# Patient Record
Sex: Male | Born: 1940 | Race: White | Hispanic: No | Marital: Married | Smoking: Former smoker
Health system: Southern US, Community
[De-identification: ages and names within clinical notes are randomized; demographics above are authoritative.]

## PROBLEM LIST (undated history)

## (undated) DIAGNOSIS — Z8701 Personal history of pneumonia (recurrent): Secondary | ICD-10-CM

## (undated) DIAGNOSIS — K08109 Complete loss of teeth, unspecified cause, unspecified class: Secondary | ICD-10-CM

## (undated) DIAGNOSIS — Z972 Presence of dental prosthetic device (complete) (partial): Secondary | ICD-10-CM

## (undated) DIAGNOSIS — Z87442 Personal history of urinary calculi: Secondary | ICD-10-CM

## (undated) DIAGNOSIS — R112 Nausea with vomiting, unspecified: Secondary | ICD-10-CM

## (undated) DIAGNOSIS — I6529 Occlusion and stenosis of unspecified carotid artery: Secondary | ICD-10-CM

## (undated) DIAGNOSIS — M1990 Unspecified osteoarthritis, unspecified site: Secondary | ICD-10-CM

## (undated) DIAGNOSIS — E785 Hyperlipidemia, unspecified: Secondary | ICD-10-CM

## (undated) DIAGNOSIS — Z9889 Other specified postprocedural states: Secondary | ICD-10-CM

## (undated) DIAGNOSIS — Z8709 Personal history of other diseases of the respiratory system: Secondary | ICD-10-CM

## (undated) DIAGNOSIS — E669 Obesity, unspecified: Secondary | ICD-10-CM

## (undated) DIAGNOSIS — H9190 Unspecified hearing loss, unspecified ear: Secondary | ICD-10-CM

## (undated) DIAGNOSIS — I10 Essential (primary) hypertension: Secondary | ICD-10-CM

## (undated) HISTORY — PX: NASAL SINUS SURGERY: SHX719

## (undated) HISTORY — PX: SPINE SURGERY: SHX786

## (undated) HISTORY — DX: Obesity, unspecified: E66.9

## (undated) HISTORY — DX: Unspecified osteoarthritis, unspecified site: M19.90

## (undated) HISTORY — DX: Essential (primary) hypertension: I10

## (undated) HISTORY — DX: Occlusion and stenosis of unspecified carotid artery: I65.29

## (undated) HISTORY — DX: Hyperlipidemia, unspecified: E78.5

---

## 2007-12-01 ENCOUNTER — Encounter: Admission: RE | Admit: 2007-12-01 | Discharge: 2007-12-01 | Payer: Self-pay | Admitting: Orthopaedic Surgery

## 2007-12-05 ENCOUNTER — Encounter: Admission: RE | Admit: 2007-12-05 | Discharge: 2007-12-05 | Payer: Self-pay | Admitting: Orthopaedic Surgery

## 2007-12-20 ENCOUNTER — Encounter: Admission: RE | Admit: 2007-12-20 | Discharge: 2007-12-20 | Payer: Self-pay | Admitting: Orthopaedic Surgery

## 2008-01-13 ENCOUNTER — Encounter: Admission: RE | Admit: 2008-01-13 | Discharge: 2008-01-13 | Payer: Self-pay | Admitting: Orthopaedic Surgery

## 2009-12-10 ENCOUNTER — Encounter: Admission: RE | Admit: 2009-12-10 | Discharge: 2009-12-10 | Payer: Self-pay | Admitting: Orthopaedic Surgery

## 2010-05-24 ENCOUNTER — Encounter: Payer: Self-pay | Admitting: Cardiovascular Disease

## 2010-06-03 ENCOUNTER — Encounter: Payer: Self-pay | Admitting: *Deleted

## 2010-06-06 ENCOUNTER — Ambulatory Visit (INDEPENDENT_AMBULATORY_CARE_PROVIDER_SITE_OTHER): Payer: Medicare Other | Admitting: Cardiovascular Disease

## 2010-06-06 ENCOUNTER — Encounter: Payer: Self-pay | Admitting: Cardiovascular Disease

## 2010-06-06 VITALS — BP 158/100 | HR 57 | Ht 72.0 in | Wt 215.2 lb

## 2010-06-06 DIAGNOSIS — I10 Essential (primary) hypertension: Secondary | ICD-10-CM | POA: Insufficient documentation

## 2010-06-06 DIAGNOSIS — R9431 Abnormal electrocardiogram [ECG] [EKG]: Secondary | ICD-10-CM | POA: Insufficient documentation

## 2010-06-06 MED ORDER — SIMVASTATIN 80 MG PO TABS: 40.0000 mg | ORAL_TABLET | Freq: Every day | ORAL | Status: DC

## 2010-06-06 NOTE — Progress Notes (Signed)
Eric Hahn Date of Birth  09/15/1940 [facility] / [facility] [address]     [address] [place], [place]  [address] [phone]  Fax  [phone]  History of Present Illness:  [facility].  No CP, dyspnea.  Walks 2-3 miles QD at the [facility].  Worked at [employer] for years. 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  No hx of syncope, no nausea or vomiting.  Eric Hahn is a middle-age gentleman who recently presented to the urgent medical care for a routine physical exam. He was found have an abnormal EKG. He denies any syncope or presyncope. He exercises at the [facility] and walks 2-3 miles every day.   Current Outpatient Prescriptions on File Prior to Visit  Medication Sig Dispense Refill  . aspirin 81 MG tablet Take 81 mg by mouth daily.        . fosinopril (MONOPRIL) 40 MG tablet Take 40 mg by mouth daily.        . simvastatin (ZOCOR) 80 MG tablet Take 80 mg by mouth at bedtime.          Allergies  Allergen Reactions  . Amoxicillin     Past Medical History  Diagnosis Date  . Hyperlipidemia   . Obesity   . Hypertension     No past surgical history on file.  History  Smoking status  . Former Smoker  . Types: Cigarettes  . Quit date: 06/06/1975  Smokeless tobacco  . Not on file    History  Alcohol Use No    No family history on file.  Reviw of Systems:  Reviewed in the HPI.  All other systems are negative.  Physical Exam: BP 158/100  Pulse 57  Ht 6' (1.829 m)  Wt 215 lb 3.2 oz (97.614 kg)  BMI 29.19 kg/m2 The patient is alert and oriented x 3.  The mood and affect are normal.  The skin is warm and dry.  Color is normal.  The HEENT exam reveals that the sclera are nonicteric.  The mucous membranes are moist.  The carotids are 2+ without bruits.  There is no thyromegaly.  There is no JVD.  The lungs are clear.  The chest wall is non tender.  The heart exam reveals a regular rate  with a normal S1 and S2.  There are no murmurs, gallops, or rubs.  The PMI is not displaced.   Abdominal exam reveals good bowel sounds.  There is no guarding or rebound.  There is no hepatosplenomegaly or tenderness.  There are no masses.  Exam of the legs reveal no clubbing, cyanosis, or edema.  The legs are without rashes.  The distal pulses are intact.  Cranial nerves II - XII are intact.  Motor and sensory functions are intact.  The gait is normal.  ECG: Sinus bradycardia. He has small inferior Q waves. Assessment / Plan:

## 2010-06-06 NOTE — Assessment & Plan Note (Signed)
Eric Hahn presents with an abnormal EKG. He's not had any chest pain, dyspnea, nausea or vomiting, or syncope. It is quite possible that EKG is normal for him. Another possibility is that he has had a silent myocardial infarction in the past. We will schedule him for a stress Myoview study for further evaluation. If this test is normal then I do not think that I need to see him on a regular basis. We'll have him return to see the doctors at urgent medical care

## 2010-06-06 NOTE — Assessment & Plan Note (Signed)
His blood pressures typically normal. He sometimes has a elevated blood pressure he does have a doctor. We'll have him followup with the doctors at [facility].

## 2010-06-06 NOTE — Patient Instructions (Addendum)
Decrease your simvastatin to 40 mg a day.  Change to Pravastatin as directed by Dr. Cleta Alberts when you run out of the Simvastatin.

## 2010-06-13 ENCOUNTER — Ambulatory Visit (HOSPITAL_COMMUNITY): Payer: Medicare Other | Attending: Cardiovascular Disease | Admitting: Radiology

## 2010-06-13 ENCOUNTER — Telehealth: Payer: Self-pay | Admitting: *Deleted

## 2010-06-13 ENCOUNTER — Encounter: Payer: Self-pay | Admitting: Cardiovascular Disease

## 2010-06-13 VITALS — Ht 72.0 in | Wt 210.0 lb

## 2010-06-13 DIAGNOSIS — R9431 Abnormal electrocardiogram [ECG] [EKG]: Secondary | ICD-10-CM | POA: Insufficient documentation

## 2010-06-13 DIAGNOSIS — I4949 Other premature depolarization: Secondary | ICD-10-CM

## 2010-06-13 MED ORDER — TECHNETIUM TC 99M TETROFOSMIN IV KIT
33.0000 | PACK | Freq: Once | INTRAVENOUS | Status: AC | PRN
  Administered 2010-06-13: 33 via INTRAVENOUS

## 2010-06-13 MED ORDER — TECHNETIUM TC 99M TETROFOSMIN IV KIT
10.3000 | PACK | Freq: Once | INTRAVENOUS | Status: AC | PRN
  Administered 2010-06-13: 10 via INTRAVENOUS

## 2010-06-13 NOTE — Telephone Encounter (Signed)
Patient called with echo results. Pt verbalized understanding. Jodette Theon Sobotka RN  

## 2010-06-13 NOTE — Telephone Encounter (Signed)
Message copied by Mahalia Longest on Mon Jun 13, 2010  5:11 PM ------      Message from: [place], [place]      Created: Mon Jun 13, 2010  4:39 PM       Please report to patient - normal study.

## 2010-06-13 NOTE — Progress Notes (Signed)
[facility] SITE 3 NUCLEAR MED [address] [place] [address] [phone]  Cardiology Nuclear Med Study  Eric Hahn is a 70 y.o. male [id number] 1940/12/21   Nuclear Med Background Indication for Stress Test:  Evaluation for Ischemia and Abnormal EKG History:  No previous documented CAD Cardiac Risk Factors: History of Smoking, Hypertension, Lipids and Obesity  Symptoms:  Palpitations   Nuclear Pre-Procedure Caffeine/Decaff Intake:  None NPO After: 5:30pm   Lungs:  clear IV 0.9% NS with Angio Cath:  20g  IV Site: R Hand  IV Started by:  Irean Hong, RN  Chest Size (in):  42 Cup Size: n/a  Height: 6' (1.829 m)  Weight:  210 lb (95.255 kg)  BMI:  Body mass index is 28.48 kg/(m^2). Tech Comments:  Took monopril this am per patient    Nuclear Med Study 1 or 2 day study: 1 day  Stress Test Type:  Stress  Reading MD: Kristeen Miss, MD  Order Authorizing Provider: P.Nahser  Resting Radionuclide: Technetium 65m Tetrofosmin  Resting Radionuclide Dose: 10.3 mCi   Stress Radionuclide:  Technetium 66m Tetrofosmin  Stress Radionuclide Dose: 33.0 mCi           Stress Protocol Rest HR: 58 Stress HR: 136  Rest BP: 124/84 Stress BP: 181/82  Exercise Time (min): 7:35 METS: 9.50   Predicted Max HR: 152 bpm % Max HR: 89.47 bpm Rate Pressure Product: 57846   Dose of Adenosine (mg):  n/a Dose of Lexiscan: n/a mg  Dose of Atropine (mg): n/a Dose of Dobutamine: n/a mcg/kg/min (at max HR)  Stress Test Technologist: Milana Na, EMT-P  Nuclear Technologist:  Domenic Polite, CNMT     Rest Procedure:  Myocardial perfusion imaging was performed at rest 45 minutes following the intravenous administration of Technetium 69m Tetrofosmin. Rest ECG: Sinus Bradycardia  Stress Procedure:  The patient exercised for 7:35.  The patient stopped due to sob, fatigue, and denied any chest pain.  There were no significant ST-T wave changes and occ pvcs.  Technetium 78m  Tetrofosmin was injected at peak exercise and myocardial perfusion imaging was performed after a brief delay. Stress ECG: No significant change from baseline ECG  QPS Raw Data Images:  Normal; no motion artifact; normal heart/lung ratio. Stress Images:  There is mild apical thinning with normal uptake in the remaining areas. Rest Images:  There is mild apical thinning with normal uptake in the remaining areas. Subtraction (SDS):  No evidence of ischemia. Transient Ischemic Dilatation (Normal <1.22):  0.95 Lung/Heart Ratio (Normal <0.45):  0.35  Quantitative Gated Spect Images QGS EDV:  117 ml QGS ESV:  44 ml QGS cine images:  NL LV Function; NL Wall Motion.  The apex contracts very well. QGS EF: 62%  Impression Exercise Capacity:  Fair exercise capacity. BP Response:  Normal blood pressure response. Clinical Symptoms:  No chest pain. ECG Impression:  No significant ST segment change suggestive of ischemia. Comparison with Prior Nuclear Study: No images to compare  Overall Impression:  Normal stress nuclear study.  There is mild apical thinning but no evidence of ischemia.   The LV function is normal.  The apex contracts especially well.   [name].

## 2010-06-14 NOTE — Progress Notes (Signed)
ROUTED TO DR. NAHSER.Eric Hahn ° ° °

## 2010-06-15 ENCOUNTER — Telehealth: Payer: Self-pay | Admitting: *Deleted

## 2010-06-15 NOTE — Telephone Encounter (Signed)
Patient called with normal nuc results. Eric Huberta Tompkins rn

## 2011-07-05 ENCOUNTER — Other Ambulatory Visit: Payer: Self-pay | Admitting: Emergency Medicine

## 2011-07-10 ENCOUNTER — Ambulatory Visit (INDEPENDENT_AMBULATORY_CARE_PROVIDER_SITE_OTHER): Payer: Medicare Other | Admitting: Emergency Medicine

## 2011-07-10 VITALS — BP 138/78 | HR 53 | Temp 97.4°F | Resp 16 | Ht 71.5 in | Wt 212.8 lb

## 2011-07-10 DIAGNOSIS — E785 Hyperlipidemia, unspecified: Secondary | ICD-10-CM

## 2011-07-10 DIAGNOSIS — I10 Essential (primary) hypertension: Secondary | ICD-10-CM

## 2011-07-10 LAB — COMPREHENSIVE METABOLIC PANEL
Albumin: 4.8 g/dL (ref 3.5–5.2)
Alkaline Phosphatase: 51 U/L (ref 39–117)
BUN: 14 mg/dL (ref 6–23)
CO2: 25 mEq/L (ref 19–32)
Glucose, Bld: 99 mg/dL (ref 70–99)
Potassium: 4.1 mEq/L (ref 3.5–5.3)

## 2011-07-10 LAB — POCT CBC
HCT, POC: 41.9 % — AB (ref 43.5–53.7)
Hemoglobin: 13.9 g/dL — AB (ref 14.1–18.1)
MCH, POC: 30.4 pg (ref 27–31.2)
MCV: 91.7 fL (ref 80–97)
MID (cbc): 0.5 (ref 0–0.9)
Platelet Count, POC: 237 10*3/uL (ref 142–424)
RBC: 4.57 M/uL — AB (ref 4.69–6.13)
WBC: 5.1 10*3/uL (ref 4.6–10.2)

## 2011-07-10 LAB — LIPID PANEL
Cholesterol: 252 mg/dL — ABNORMAL HIGH (ref 0–200)
HDL: 38 mg/dL — ABNORMAL LOW (ref >39)
Total CHOL/HDL Ratio: 6.6 Ratio

## 2011-07-10 LAB — TSH: TSH: 2.554 u[IU]/mL (ref 0.350–4.500)

## 2011-07-10 MED ORDER — FOSINOPRIL SODIUM 40 MG PO TABS: 40.0000 mg | ORAL_TABLET | Freq: Every day | ORAL | Status: DC

## 2011-07-10 MED ORDER — PRAVASTATIN SODIUM 40 MG PO TABS: 40.0000 mg | ORAL_TABLET | Freq: Every day | ORAL | Status: DC

## 2011-07-10 NOTE — Progress Notes (Signed)
  Subjective:    Patient ID: Eric Hahn, male    DOB: [birth year], 71 y.o.   MRN: [id number]  HPI patient presents today for medication refills and lab work. Hasn't been in office since April 2012. Currently taking medications (pravastatin, fosinopril, and asa) Overall been feeling great. He is fasting today. Up to date on colonoscopy last 2008. No chest pain sob, exercises at [employer] x 5 days a week.     Review of Systems he denies chest pain shortness of breath. He denies any GI symptoms. Last year he went to the cardiologist and had a cardiac workup because of Q waves present on his resting EKG however workup revealed no evidence of cardiac muscle at risk.     Objective:   Physical Exam  Constitutional: He appears well-developed and well-nourished.  HENT:  Right Ear: External ear normal.  Left Ear: External ear normal.  Eyes: Pupils are equal, round, and reactive to light.  Neck: No thyromegaly present.  Cardiovascular: Normal rate and regular rhythm.   Pulmonary/Chest: Effort normal and breath sounds normal. No respiratory distress. He has no wheezes. He has no rales.  Abdominal: He exhibits no distension and no mass. There is no tenderness. There is no rebound and no guarding.    Results for orders placed in visit on 07/10/11  POCT CBC      Component Value Range   WBC 5.1  4.6 - 10.2 (K/uL)   Lymph, poc 2.0  0.6 - 3.4    POC LYMPH PERCENT 39.4  10 - 50 (%L)   MID (cbc) 0.5  0 - 0.9    POC MID % 9.7  0 - 12 (%M)   POC Granulocyte 2.6  2 - 6.9    Granulocyte percent 50.9  37 - 80 (%G)   RBC 4.57 (*) 4.69 - 6.13 (M/uL)   Hemoglobin 13.9 (*) 14.1 - 18.1 (g/dL)   HCT, POC 40.9 (*) 81.1 - 53.7 (%)   MCV 91.7  80 - 97 (fL)   MCH, POC 30.4  27 - 31.2 (pg)   MCHC 33.2  31.8 - 35.4 (g/dL)   RDW, POC 91.4     Platelet Count, POC 237  142 - 424 (K/uL)   MPV 10.0  0 - 99.8 (fL)        Assessment & Plan:  Patient doing well under treatment for hypertension and high cholesterol.  Meds refilled for one year

## 2011-07-11 LAB — PSA, MEDICARE: PSA: 2.3 ng/mL (ref <=4.00)

## 2011-07-13 ENCOUNTER — Other Ambulatory Visit: Payer: Self-pay | Admitting: Emergency Medicine

## 2011-07-13 DIAGNOSIS — E785 Hyperlipidemia, unspecified: Secondary | ICD-10-CM

## 2011-07-13 MED ORDER — ROSUVASTATIN CALCIUM 10 MG PO TABS: 10.0000 mg | ORAL_TABLET | Freq: Every day | ORAL | Status: DC

## 2011-07-13 NOTE — Progress Notes (Signed)
Mailed pt his Rx for Crestor - see notes on lab results

## 2012-05-17 ENCOUNTER — Telehealth: Payer: Self-pay

## 2012-05-17 ENCOUNTER — Ambulatory Visit (INDEPENDENT_AMBULATORY_CARE_PROVIDER_SITE_OTHER): Payer: Medicare Other | Admitting: Emergency Medicine

## 2012-05-17 VITALS — BP 154/92 | HR 55 | Temp 98.6°F | Resp 16 | Ht 72.0 in | Wt 220.0 lb

## 2012-05-17 DIAGNOSIS — I10 Essential (primary) hypertension: Secondary | ICD-10-CM

## 2012-05-17 DIAGNOSIS — E7800 Pure hypercholesterolemia, unspecified: Secondary | ICD-10-CM

## 2012-05-17 DIAGNOSIS — Z139 Encounter for screening, unspecified: Secondary | ICD-10-CM

## 2012-05-17 LAB — POCT CBC
Lymph, poc: 1.8 (ref 0.6–3.4)
MCH, POC: 30.6 pg (ref 27–31.2)
MCHC: 33.1 g/dL (ref 31.8–35.4)
MID (cbc): 0.6 (ref 0–0.9)
MPV: 8.9 fL (ref 0–99.8)
POC Granulocyte: 2.9 (ref 2–6.9)
POC LYMPH PERCENT: 34.3 %L (ref 10–50)
POC MID %: 10.4 %M (ref 0–12)
RDW, POC: 13.4 %
WBC: 5.3 10*3/uL (ref 4.6–10.2)

## 2012-05-17 LAB — COMPREHENSIVE METABOLIC PANEL
AST: 18 U/L (ref 0–37)
Albumin: 4.6 g/dL (ref 3.5–5.2)
Alkaline Phosphatase: 55 U/L (ref 39–117)
BUN: 15 mg/dL (ref 6–23)
Calcium: 9.6 mg/dL (ref 8.4–10.5)
Chloride: 104 mEq/L (ref 96–112)
Glucose, Bld: 109 mg/dL — ABNORMAL HIGH (ref 70–99)
Potassium: 4.2 mEq/L (ref 3.5–5.3)
Sodium: 138 mEq/L (ref 135–145)
Total Protein: 7.4 g/dL (ref 6.0–8.3)

## 2012-05-17 LAB — LIPID PANEL: Cholesterol: 328 mg/dL — ABNORMAL HIGH (ref 0–200)

## 2012-05-17 MED ORDER — FOSINOPRIL SODIUM 40 MG PO TABS: 40.0000 mg | ORAL_TABLET | Freq: Every day | ORAL | Status: DC

## 2012-05-17 NOTE — Progress Notes (Signed)
  Subjective:    Patient ID: Eric Hahn, male    DOB: 12-04-1940, 72 y.o.   MRN: [id number]  HPI for followup of high blood pressure high cholesterol. His problem has been that he has been intolerant to a number of cholesterol drugs. He has tried Zocor Crestor pravastatin and Lipitor. Earlier in the year he had a lot of discomfort in his muscles with severe aching and stopped his Crestor at that time. When he stopped the Crestor he felt better. He denies any episodes of chest pain shortness of breath or GI problems. He is up-to-date on his immunizations for shingles Pneumovax flu. He is up-to-date on his colonoscopy.    Review of Systems     Objective:   Physical Exam patient is alert and cooperative in no distress. His neck is supple. Chest was clear. Cardiac regular no murmurs. Abdomen is soft nontender. Results for orders placed in visit on 05/17/12  POCT CBC      Result Value Range   WBC 5.3  4.6 - 10.2 K/uL   Lymph, poc 1.8  0.6 - 3.4   POC LYMPH PERCENT 34.3  10 - 50 %L   MID (cbc) 0.6  0 - 0.9   POC MID % 10.4  0 - 12 %M   POC Granulocyte 2.9  2 - 6.9   Granulocyte percent 55.3  37 - 80 %G   RBC 4.67 (*) 4.69 - 6.13 M/uL   Hemoglobin 14.3  14.1 - 18.1 g/dL   HCT, POC 40.9 (*) 81.1 - 53.7 %   MCV 92.6  80 - 97 fL   MCH, POC 30.6  27 - 31.2 pg   MCHC 33.1  31.8 - 35.4 g/dL   RDW, POC 91.4     Platelet Count, POC 265  142 - 424 K/uL   MPV 8.9  0 - 99.8 fL  IFOBT (OCCULT BLOOD)      Result Value Range   IFOBT Negative           Assessment & Plan:  Routine labs today. It appears the patient is to statin intolerant and not able to take any of the statins. We can try him on fish oil, red yeast rice ,and flaxseed oil and see if this can control his lipids.

## 2012-05-17 NOTE — Telephone Encounter (Signed)
It has been sent to them already, Monopril, not Lisinopril

## 2012-05-17 NOTE — Telephone Encounter (Signed)
Patient says he lisinopril rx needs to be sent to [facility] pharmacy please call if questions [phone]

## 2012-05-21 ENCOUNTER — Telehealth: Payer: Self-pay | Admitting: Radiology

## 2012-05-21 DIAGNOSIS — E785 Hyperlipidemia, unspecified: Secondary | ICD-10-CM

## 2012-05-21 NOTE — Telephone Encounter (Signed)
Message copied by Caffie Damme on Tue May 21, 2012 10:27 AM ------      Message from: [place], [facility] A      Created: Fri May 17, 2012  7:32 PM                   ----- Message -----         From: [name], MD         Sent: 05/17/2012   5:34 PM           To: [facility] Clinical Message Pool            His cholesterol is 328 which puts him at extreme risk for cardiac disease. I would like to get him an appointment to see one of the cardiac specialists to discuss with him ways we could treat his high cholesterol since he has been intolerant to statins ------

## 2012-05-21 NOTE — Telephone Encounter (Signed)
Patient advised, referral made

## 2012-06-06 ENCOUNTER — Ambulatory Visit: Payer: Medicare Other | Admitting: Physician Assistant

## 2012-06-10 ENCOUNTER — Encounter: Payer: Self-pay | Admitting: Physician Assistant

## 2012-06-10 ENCOUNTER — Ambulatory Visit (INDEPENDENT_AMBULATORY_CARE_PROVIDER_SITE_OTHER): Payer: Medicare Other | Admitting: Physician Assistant

## 2012-06-10 ENCOUNTER — Telehealth: Payer: Self-pay | Admitting: Physician Assistant

## 2012-06-10 VITALS — BP 150/87 | HR 61 | Ht 73.0 in | Wt 219.0 lb

## 2012-06-10 DIAGNOSIS — E785 Hyperlipidemia, unspecified: Secondary | ICD-10-CM | POA: Insufficient documentation

## 2012-06-10 DIAGNOSIS — I10 Essential (primary) hypertension: Secondary | ICD-10-CM

## 2012-06-10 MED ORDER — LOVASTATIN 20 MG PO TABS: ORAL_TABLET | ORAL | Status: DC

## 2012-06-10 NOTE — Progress Notes (Signed)
[address] [place], [place]  [address] Phone: [phone] Fax:  [phone]  Date:  06/10/2012   ID:  Eric Hahn, DOB 06-18-1940, MRN [id number]  PCP:  Lucilla Edin, MD  Primary Cardiologist:  Dr. Delane Ginger     History of Present Illness: Eric Hahn is a 72 y.o. male who returns for evaluation and management of hyperlipidemia.  He also has a history of hypertension. He saw Dr. Elease Hashimoto in 05/2010 for an abnormal ECG. He underwent Myoview 4/12: Apical thinning, no ischemia, EF 62%. Recently seen by his PCP. He has been intolerant to many statins. Simvastatin was discontinued possibly due to palpitations. Lipitor was discontinued due to elevated LFTs. Pravastatin provided no help. Patient developed panic attacks with Crestor. He denies chest pain, syncope, orthopnea, PND, edema or significant dyspnea. He has a family history of CAD. His father had a myocardial infarction in his 2s. He is a prior smoker. He does not have diabetes.  Lab Results  Component Value Date   CHOL 328* 05/17/2012   CHOL 252* 07/10/2011   Lab Results  Component Value Date   HDL 40 05/17/2012   HDL 38* 1/32/4401   Lab Results  Component Value Date   LDLCALC 248* 05/17/2012   LDLCALC 176* 07/10/2011   Lab Results  Component Value Date   TRIG 198* 05/17/2012   TRIG 191* 07/10/2011   Lab Results  Component Value Date   CHOLHDL 8.2 05/17/2012   CHOLHDL 6.6 07/10/2011   No results found for this basename: LDLDIRECT     Labs (5/13):  TSH 2.554 Labs (4/14):  K 4.2, Cr 0.90, ALT 23,Hgb 14.3  Wt Readings from Last 3 Encounters:  06/10/12 219 lb (99.338 kg)  05/17/12 220 lb (99.791 kg)  07/10/11 212 lb 12.8 oz (96.525 kg)     Past Medical History  Diagnosis Date  . Hyperlipidemia   . Obesity   . Hypertension     Current Outpatient Prescriptions  Medication Sig Dispense Refill  . aspirin 81 MG tablet Take 81 mg by mouth daily.        Marland Kitchen FLAX OIL-FISH OIL-BORAGE OIL PO Take by  mouth. 800 mg daily      . Flaxseed, Linseed, (FLAX SEED OIL) 1000 MG CAPS Take by mouth. daily      . fosinopril (MONOPRIL) 40 MG tablet Take 1 tablet (40 mg total) by mouth daily.  90 tablet  3  . Red Yeast Rice 600 MG TABS Take by mouth. Daily      . lovastatin (MEVACOR) 20 MG tablet TAKE ONLY ON MON, WED, AND FRI'S  90 tablet  3   No current facility-administered medications for this visit.    Allergies:    Allergies  Allergen Reactions  . Amoxicillin   . Statins     Pt is intolerant-muscle aches and weakness    Social History:  The patient  reports that he quit smoking about 37 years ago. His smoking use included Cigarettes. He smoked 0.00 packs per day. He does not have any smokeless tobacco history on file. He reports that he does not drink alcohol or use illicit drugs.   ROS:  Please see the history of present illness.   All other systems reviewed and negative.   PHYSICAL EXAM: VS:  BP 150/87  Pulse 61  Ht 6\' 1"  (1.854 m)  Wt 219 lb (99.338 kg)  BMI 28.9 kg/m2 Well nourished, well developed, in no acute distress  HEENT: normal Neck: no JVD Cardiac:  normal S1, S2; RRR; no murmur Lungs:  clear to auscultation bilaterally, no wheezing, rhonchi or rales Abd: soft, nontender, no hepatomegaly Ext: no edema Skin: warm and dry Neuro:  CNs 2-12 intact, no focal abnormalities noted  EKG:  Sinus brady, HR 57, NSSTTW changes     ASSESSMENT AND PLAN:  1. Hyperlipidemia:  He has tried many different statins and has been unable to tolerate all of them.  Current guidelines would not support changing to an alternate non-statin therapy for primary prevention.  His lipid levels are quite elevated.  He does not have proven vascular disease.  We discussed many different options.  I would like for him to see our Lipid Clinic to see if they can help.  I have also suggested trying Livalo or Lovastatin.  We do not have Livalo samples.  Therefore, I will have him take Lovastatin 20 mg QHS on  Mon, Wed, Fri only.  Check Lipids and LFTs in 3 mos.   2. Hypertension:  Usually better controlled.  Continue current Rx. 3. Disposition:  Refer to Lipid clinic.  F/u with Dr. Delane Ginger in 3 mos.   Signed, Tereso Newcomer, PA-C  5:12 PM 06/10/2012

## 2012-06-10 NOTE — Telephone Encounter (Signed)
Called pt to schedule for lipid clinic per request in staff message. Per pt he can not schedule at this time he has things to do and he will call back to schedule

## 2012-06-10 NOTE — Patient Instructions (Addendum)
START LOVASTATIN 20 MG TABLET; TAKE ONLY ON MON, WED, AND FRI'S RX SENT TO [facility] X 3  FASTING LIPID AND LIVER PANEL TO BE DONE IN 3 MONTHS 09/13/12  PLEASE FOLLOW UP WITH DR. Elease Hashimoto IN 3 MONTHS  PLEASE REFER TO LIPID CLINIC DX 272.4

## 2012-06-13 ENCOUNTER — Other Ambulatory Visit: Payer: Self-pay | Admitting: Emergency Medicine

## 2012-06-14 ENCOUNTER — Other Ambulatory Visit: Payer: Self-pay

## 2012-06-14 ENCOUNTER — Other Ambulatory Visit (HOSPITAL_COMMUNITY): Payer: Medicare Other

## 2012-06-14 ENCOUNTER — Other Ambulatory Visit: Payer: Self-pay | Admitting: *Deleted

## 2012-06-14 DIAGNOSIS — I10 Essential (primary) hypertension: Secondary | ICD-10-CM

## 2012-06-14 MED ORDER — FOSINOPRIL SODIUM 40 MG PO TABS: 40.0000 mg | ORAL_TABLET | Freq: Every day | ORAL | Status: DC

## 2012-06-14 NOTE — Telephone Encounter (Signed)
ERROR

## 2012-09-18 ENCOUNTER — Other Ambulatory Visit: Payer: Self-pay

## 2012-09-19 ENCOUNTER — Telehealth: Payer: Self-pay | Admitting: *Deleted

## 2012-09-19 NOTE — Telephone Encounter (Signed)
Called Mr. Schalk to schedule 3 month follow-up with Dr. Elease Hashimoto. Patient does not wish to follow-up with his heart doctor anymore. He will continue to be seen by his primary care physician. I will forward message to Jodette.

## 2012-09-20 NOTE — Telephone Encounter (Signed)
noted 

## 2012-12-19 ENCOUNTER — Other Ambulatory Visit: Payer: Self-pay

## 2013-07-01 ENCOUNTER — Encounter: Payer: Medicare Other | Admitting: Emergency Medicine

## 2013-07-10 ENCOUNTER — Encounter: Payer: Self-pay | Admitting: Emergency Medicine

## 2013-07-10 ENCOUNTER — Ambulatory Visit (INDEPENDENT_AMBULATORY_CARE_PROVIDER_SITE_OTHER): Payer: Medicare Other | Admitting: Emergency Medicine

## 2013-07-10 VITALS — BP 126/90 | HR 60 | Temp 97.7°F | Resp 16 | Ht 71.0 in | Wt 213.0 lb

## 2013-07-10 DIAGNOSIS — I10 Essential (primary) hypertension: Secondary | ICD-10-CM

## 2013-07-10 DIAGNOSIS — E7800 Pure hypercholesterolemia, unspecified: Secondary | ICD-10-CM

## 2013-07-10 DIAGNOSIS — Z125 Encounter for screening for malignant neoplasm of prostate: Secondary | ICD-10-CM

## 2013-07-10 DIAGNOSIS — E785 Hyperlipidemia, unspecified: Secondary | ICD-10-CM

## 2013-07-10 DIAGNOSIS — Z139 Encounter for screening, unspecified: Secondary | ICD-10-CM

## 2013-07-10 DIAGNOSIS — Z0000 Encounter for general adult medical examination without abnormal findings: Secondary | ICD-10-CM

## 2013-07-10 DIAGNOSIS — E7200 Disorders of amino-acid transport, unspecified: Secondary | ICD-10-CM

## 2013-07-10 DIAGNOSIS — Z23 Encounter for immunization: Secondary | ICD-10-CM

## 2013-07-10 LAB — LIPID PANEL
Cholesterol: 297 mg/dL — ABNORMAL HIGH (ref 0–200)
HDL: 43 mg/dL (ref >39)
LDL CALC: 211 mg/dL — AB (ref 0–99)
TRIGLYCERIDES: 216 mg/dL — AB (ref <150)
Total CHOL/HDL Ratio: 6.9 Ratio
VLDL: 43 mg/dL — ABNORMAL HIGH (ref 0–40)

## 2013-07-10 LAB — POCT URINALYSIS DIPSTICK
Bilirubin, UA: NEGATIVE
Blood, UA: NEGATIVE
Glucose, UA: NEGATIVE
KETONES UA: NEGATIVE
Leukocytes, UA: NEGATIVE
Nitrite, UA: NEGATIVE
Protein, UA: NEGATIVE
Spec Grav, UA: 1.015
UROBILINOGEN UA: 0.2
pH, UA: 7

## 2013-07-10 LAB — COMPLETE METABOLIC PANEL WITH GFR
ALT: 22 U/L (ref 0–53)
AST: 19 U/L (ref 0–37)
Albumin: 4.4 g/dL (ref 3.5–5.2)
Alkaline Phosphatase: 54 U/L (ref 39–117)
BUN: 16 mg/dL (ref 6–23)
CHLORIDE: 101 meq/L (ref 96–112)
CO2: 22 meq/L (ref 19–32)
CREATININE: 0.89 mg/dL (ref 0.50–1.35)
Calcium: 9.7 mg/dL (ref 8.4–10.5)
GFR, Est Non African American: 86 mL/min
Glucose, Bld: 101 mg/dL — ABNORMAL HIGH (ref 70–99)
Potassium: 3.9 mEq/L (ref 3.5–5.3)
Sodium: 135 mEq/L (ref 135–145)
Total Bilirubin: 0.7 mg/dL (ref 0.2–1.2)
Total Protein: 7 g/dL (ref 6.0–8.3)

## 2013-07-10 LAB — CBC WITH DIFFERENTIAL/PLATELET
BASOS PCT: 0 % (ref 0–1)
Basophils Absolute: 0 10*3/uL (ref 0.0–0.1)
Eosinophils Absolute: 0.3 10*3/uL (ref 0.0–0.7)
Eosinophils Relative: 5 % (ref 0–5)
HEMATOCRIT: 42.7 % (ref 39.0–52.0)
HEMOGLOBIN: 14.9 g/dL (ref 13.0–17.0)
LYMPHS PCT: 34 % (ref 12–46)
Lymphs Abs: 2 10*3/uL (ref 0.7–4.0)
MCH: 30.3 pg (ref 26.0–34.0)
MCHC: 34.9 g/dL (ref 30.0–36.0)
MCV: 87 fL (ref 78.0–100.0)
MONO ABS: 0.4 10*3/uL (ref 0.1–1.0)
MONOS PCT: 7 % (ref 3–12)
NEUTROS ABS: 3.2 10*3/uL (ref 1.7–7.7)
Neutrophils Relative %: 54 % (ref 43–77)
Platelets: 260 10*3/uL (ref 150–400)
RBC: 4.91 MIL/uL (ref 4.22–5.81)
RDW: 13.6 % (ref 11.5–15.5)
WBC: 5.9 10*3/uL (ref 4.0–10.5)

## 2013-07-10 LAB — IFOBT (OCCULT BLOOD): IFOBT: NEGATIVE

## 2013-07-10 MED ORDER — FOSINOPRIL SODIUM 40 MG PO TABS: 40.0000 mg | ORAL_TABLET | Freq: Every day | ORAL | Status: DC

## 2013-07-10 NOTE — Progress Notes (Signed)
   Subjective:    Patient ID: Eric Hahn, male    DOB: 1940-05-20, 73 y.o.   MRN: [id number] This chart was scribed for Lesle Chris, MD by Marica Otter, ED Scribe. This patient was seen in room 21 and the patient's care was started at 10:21 AM.     HPI  HPI Comments: Eric Hahn is a 73 y.o. male, with  who [facility] exam. Pt reports he has been doing well and has no present complaints.  Cardiac Health: Pt denies annual visits with his cardiologist generally, but, reports that he saw a cardiologist a couple of times in the last 3 years due to an abnormal ECG from 05/2010 and high cholesterol. Pt reports he had a stress test 3 years ago. Pt reports he did not tolerate statin drugs.  Colonoscopy: Pt reports his last colonoscopy was in June 2009 and he was told his next check will be in 10 years.  Prostate: Pt denies any problems with his prostate.  Tetanus Shot: Pt reports he believes he is utd, but not sure.   Pneumonia Vaccine: Pt reports he would like to receive a pneumonia vaccine.   Review of Systems  Constitutional: Negative for fatigue and unexpected weight change.  Eyes: Negative for visual disturbance.  Respiratory: Negative for cough, chest tightness and shortness of breath.   Cardiovascular: Negative for chest pain, palpitations and leg swelling.  Gastrointestinal: Negative for abdominal pain and blood in stool.  Neurological: Negative for dizziness, light-headedness and headaches.   Objective:   Physical Exam CONSTITUTIONAL: Well developed/well nourished HEAD: Normocephalic/atraumatic, full dentures EYES: EOMI/PERRL ENMT: Mucous membranes moist NECK: supple no meningeal signs SPINE:entire spine nontender CV: S1/S2 noted, no murmurs/rubs/gallops noted LUNGS: Lungs are clear to auscultation bilaterally, no apparent distress ABDOMEN: soft, nontender, no rebound or guarding GU:no cva tenderness NEURO: Pt is  awake/alert, moves all extremitiesx4 EXTREMITIES: pulses normal, full ROM SKIN: warm, color normal PSYCH: no abnormalities of mood noted Prostate exam is normal   Ekg nonspecific ST-T changes unchanged from previous  Results for orders placed in visit on 07/10/13  IFOBT (OCCULT BLOOD)      Result Value Ref Range   IFOBT Negative    POCT URINALYSIS DIPSTICK      Result Value Ref Range   Color, UA yellow     Clarity, UA clear     Glucose, UA neg     Bilirubin, UA neg     Ketones, UA neg     Spec Grav, UA 1.015     Blood, UA neg     pH, UA 7.0     Protein, UA neg     Urobilinogen, UA 0.2     Nitrite, UA neg     Leukocytes, UA Negative          Assessment & Plan:   The patient is statin intolerant. He has tried multiple statin drugs and is unable to tolerate any of them. He's not really interested in trying Zetia or WelChol because the data does not demonstrate improvement regarding cardiovascular events. I personally performed the services described in this documentation, which was scribed in my presence. The recorded information has been reviewed and is accurate. Marland Kitchen

## 2013-07-11 LAB — PSA, MEDICARE: PSA: 2.24 ng/mL (ref <=4.00)

## 2013-11-28 ENCOUNTER — Other Ambulatory Visit: Payer: Self-pay

## 2014-04-13 ENCOUNTER — Encounter: Payer: Self-pay | Admitting: *Deleted

## 2014-06-16 ENCOUNTER — Encounter: Payer: Self-pay | Admitting: *Deleted

## 2014-07-22 ENCOUNTER — Ambulatory Visit (INDEPENDENT_AMBULATORY_CARE_PROVIDER_SITE_OTHER): Payer: Medicare Other | Admitting: Emergency Medicine

## 2014-07-22 VITALS — BP 128/82 | HR 75 | Temp 98.5°F | Resp 17 | Ht 71.5 in | Wt 207.0 lb

## 2014-07-22 DIAGNOSIS — E7800 Pure hypercholesterolemia, unspecified: Secondary | ICD-10-CM

## 2014-07-22 DIAGNOSIS — R221 Localized swelling, mass and lump, neck: Secondary | ICD-10-CM | POA: Diagnosis not present

## 2014-07-22 DIAGNOSIS — I10 Essential (primary) hypertension: Secondary | ICD-10-CM

## 2014-07-22 LAB — BASIC METABOLIC PANEL WITH GFR
BUN: 15 mg/dL (ref 6–23)
CHLORIDE: 106 meq/L (ref 96–112)
CO2: 24 mEq/L (ref 19–32)
Calcium: 9.3 mg/dL (ref 8.4–10.5)
Creat: 0.86 mg/dL (ref 0.50–1.35)
GFR, EST NON AFRICAN AMERICAN: 87 mL/min
Glucose, Bld: 105 mg/dL — ABNORMAL HIGH (ref 70–99)
Potassium: 4.4 mEq/L (ref 3.5–5.3)
Sodium: 140 mEq/L (ref 135–145)

## 2014-07-22 LAB — POCT CBC
Granulocyte percent: 55.2 %G (ref 37–80)
HCT, POC: 42.4 % — AB (ref 43.5–53.7)
Hemoglobin: 14.3 g/dL (ref 14.1–18.1)
Lymph, poc: 1.9 (ref 0.6–3.4)
MCH, POC: 30.1 pg (ref 27–31.2)
MCHC: 33.7 g/dL (ref 31.8–35.4)
MCV: 89.3 fL (ref 80–97)
MID (cbc): 0.4 (ref 0–0.9)
MPV: 7.7 fL (ref 0–99.8)
POC Granulocyte: 2.8 (ref 2–6.9)
POC LYMPH %: 37.8 % (ref 10–50)
POC MID %: 7 %M (ref 0–12)
Platelet Count, POC: 242 10*3/uL (ref 142–424)
RBC: 4.75 M/uL (ref 4.69–6.13)
RDW, POC: 13.7 %
WBC: 5.1 10*3/uL (ref 4.6–10.2)

## 2014-07-22 LAB — LIPID PANEL
Cholesterol: 279 mg/dL — ABNORMAL HIGH (ref 0–200)
HDL: 36 mg/dL — ABNORMAL LOW (ref >=40)
LDL CALC: 196 mg/dL — AB (ref 0–99)
TRIGLYCERIDES: 235 mg/dL — AB (ref <150)
Total CHOL/HDL Ratio: 7.8 Ratio
VLDL: 47 mg/dL — ABNORMAL HIGH (ref 0–40)

## 2014-07-22 LAB — TSH: TSH: 2.349 u[IU]/mL (ref 0.350–4.500)

## 2014-07-22 MED ORDER — FOSINOPRIL SODIUM 40 MG PO TABS: 40.0000 mg | ORAL_TABLET | Freq: Every day | ORAL | Status: DC

## 2014-07-22 NOTE — Patient Instructions (Signed)
Please make an appointment to see your gastroenterologist. Please make an appointment for a good eye check. Please schedule a physical sometime in the fall. I have scheduled you for CT of your neck to evaluate the area felt on physical exam.

## 2014-07-22 NOTE — Progress Notes (Signed)
Subjective:  This chart was scribed for Earl Lites, MD by Andrew Au, ED Scribe. This patient was seen in room 9 and the patient's care was started at 8:18 AM.   Patient ID: Eric Hahn, male    DOB: 08/12/1940, 74 y.o.   MRN: [id number]  HPI Chief Complaint  Patient presents with  . Medication Refill    fosinopril    HPI Comments: Eric Hahn is a 74 y.o. male who presents to the [facility] for a medication refill. Pt was last seen by me last year. States he has been doing well. He plans to schedule colonoscopy this year at [facility] and denies needing a referral . States his immunizations are up to date. Pt is seen by optometry every 4 years. Pt is a former smoker who quit over 20 years ago. Pt is fasting. Pt denies back pain and abdominal pain. Pt is planning a vacation to [place] in the July.   Past Medical History  Diagnosis Date  . Hyperlipidemia   . Obesity   . Hypertension    Allergies  Allergen Reactions  . Amoxicillin   . Statins     Pt is intolerant-muscle aches and weakness   Prior to Admission medications   Medication Sig Start Date End Date Taking? Authorizing Provider  aspirin 81 MG tablet Take 81 mg by mouth daily.     Yes Historical Provider, MD  FLAX OIL-FISH OIL-BORAGE OIL PO Take by mouth. 800 mg daily   Yes Historical Provider, MD  Flaxseed, Linseed, (FLAX SEED OIL) 1000 MG CAPS Take by mouth. daily   Yes Historical Provider, MD  fosinopril (MONOPRIL) 40 MG tablet Take 1 tablet (40 mg total) by mouth daily. 07/10/13  Yes [name], MD  Red Yeast Rice 600 MG TABS Take by mouth. Daily   Yes Historical Provider, MD  lovastatin (MEVACOR) 20 MG tablet TAKE ONLY ON MON, WED, AND FRI'S Patient not taking: Reported on 07/22/2014 06/10/12   Beatrice Lecher, PA-C   Review of Systems  Gastrointestinal: Negative for abdominal pain.  Musculoskeletal: Negative for back pain.   Objective:   Physical Exam CONSTITUTIONAL: Well developed/well  nourished HEAD: Normocephalic/atraumatic EYES: EOMI/PERRL ENMT: Mucous membranes moist 1.5 cm nodule adjacent to submaxillary gland. NECK: supple no meningeal signs.  SPINE/BACK:entire spine nontender CV: S1/S2 noted, no murmurs/rubs/gallops noted LUNGS: Lungs are clear to auscultation bilaterally, no apparent distress ABDOMEN: soft, nontender, no rebound or guarding, bowel sounds noted throughout abdomen GU:no cva tenderness NEURO: Pt is awake/alert/appropriate, moves all extremitiesx4.  No facial droop.   EXTREMITIES: pulses normal/equal, full ROM SKIN: warm, color normal PSYCH: no abnormalities of mood noted, alert and oriented to situation Results for orders placed or performed in visit on 07/22/14  POCT CBC  Result Value Ref Range   WBC 5.1 4.6 - 10.2 K/uL   Lymph, poc 1.9 0.6 - 3.4   POC LYMPH PERCENT 37.8 10 - 50 %L   MID (cbc) 0.4 0 - 0.9   POC MID % 7.0 0 - 12 %M   POC Granulocyte 2.8 2 - 6.9   Granulocyte percent 55.2 37 - 80 %G   RBC 4.75 4.69 - 6.13 M/uL   Hemoglobin 14.3 14.1 - 18.1 g/dL   HCT, POC 40.9 (A) 81.1 - 53.7 %   MCV 89.3 80 - 97 fL   MCH, POC 30.1 27 - 31.2 pg   MCHC 33.7 31.8 - 35.4 g/dL   RDW, POC  13.7 %   Platelet Count, POC 242 142 - 424 K/uL   MPV 7.7 0 - 99.8 fL   Filed Vitals:   07/22/14 0816  BP: 128/82  Pulse: 75  Temp: 98.5 F (36.9 C)  TempSrc: Oral  Resp: 17  Height: 5' 11.5" (1.816 m)  Weight: 207 lb (93.895 kg)  SpO2: 98%    Assessment & Plan:   1. Essential hypertension  No change in blood pressure medications   2. Elevated cholesterol  Lipid panel done today. He has been on flaxseed oil but not on statins. He is allergic to statin   3. Neck mass  We'll schedule CT the neck.     I personally performed the services described in this documentation, which was scribed in my presence. The recorded information has been reviewed and is accurate.  Earl LitesSteve Meghen Akopyan, MD

## 2014-07-23 ENCOUNTER — Encounter: Payer: Self-pay | Admitting: Family Medicine

## 2014-07-23 ENCOUNTER — Telehealth: Payer: Self-pay | Admitting: Emergency Medicine

## 2014-07-23 NOTE — Telephone Encounter (Signed)
Attempt to call pt to sched per [facility] referral but per pt's wife pt doesn't want to be seen.   Erin Sons  [facility]

## 2014-07-23 NOTE — Telephone Encounter (Signed)
This patient is estranged from his wife. Referrals needs to talk to the patient himself

## 2014-07-24 NOTE — Telephone Encounter (Signed)
Left message for pt to call back  °

## 2014-07-28 ENCOUNTER — Ambulatory Visit
Admission: RE | Admit: 2014-07-28 | Discharge: 2014-07-28 | Disposition: A | Discharge status: Home / Self Care | Payer: PRIVATE HEALTH INSURANCE | Source: Ambulatory Visit | Attending: Emergency Medicine | Admitting: Emergency Medicine

## 2014-07-28 ENCOUNTER — Other Ambulatory Visit: Payer: Self-pay | Admitting: Emergency Medicine

## 2014-07-28 DIAGNOSIS — R221 Localized swelling, mass and lump, neck: Secondary | ICD-10-CM

## 2014-07-28 DIAGNOSIS — I6523 Occlusion and stenosis of bilateral carotid arteries: Secondary | ICD-10-CM

## 2014-07-28 MED ORDER — IOPAMIDOL (ISOVUE-300) INJECTION 61%
75.0000 mL | Freq: Once | INTRAVENOUS | Status: AC | PRN
  Administered 2014-07-28: 75 mL via INTRAVENOUS

## 2014-08-04 ENCOUNTER — Encounter: Payer: Self-pay | Admitting: Radiology

## 2014-08-07 ENCOUNTER — Telehealth: Payer: Self-pay

## 2014-08-07 NOTE — Telephone Encounter (Signed)
Just an FYI -patients spouse called requesting the status of an appointment for patient to have an ultra sound. I didn't see one scheduled and I had a hard time finding the order but I found it. Anyways just wanted to let Dr Cleta Alberts know I called [facility] today and  scheduled patient for the US Carotid duplex bilateral on Monday 08/10/14 at 1:40 pm.

## 2014-08-10 ENCOUNTER — Ambulatory Visit
Admission: RE | Admit: 2014-08-10 | Discharge: 2014-08-10 | Disposition: A | Discharge status: Home / Self Care | Payer: PRIVATE HEALTH INSURANCE | Source: Ambulatory Visit | Attending: Emergency Medicine | Admitting: Emergency Medicine

## 2014-08-10 DIAGNOSIS — I6523 Occlusion and stenosis of bilateral carotid arteries: Secondary | ICD-10-CM

## 2014-08-11 ENCOUNTER — Other Ambulatory Visit: Payer: Self-pay | Admitting: Emergency Medicine

## 2014-08-11 ENCOUNTER — Ambulatory Visit: Payer: Medicare Other | Admitting: Emergency Medicine

## 2014-08-11 DIAGNOSIS — I6521 Occlusion and stenosis of right carotid artery: Secondary | ICD-10-CM

## 2014-08-12 ENCOUNTER — Encounter: Payer: Self-pay | Admitting: *Deleted

## 2014-08-19 ENCOUNTER — Other Ambulatory Visit: Payer: Self-pay

## 2014-08-19 DIAGNOSIS — I6521 Occlusion and stenosis of right carotid artery: Secondary | ICD-10-CM

## 2014-09-07 ENCOUNTER — Encounter: Payer: Self-pay | Admitting: Vascular Surgery

## 2014-09-10 ENCOUNTER — Encounter: Payer: Self-pay | Admitting: Vascular Surgery

## 2014-09-10 ENCOUNTER — Ambulatory Visit (INDEPENDENT_AMBULATORY_CARE_PROVIDER_SITE_OTHER): Payer: Medicare Other | Admitting: Vascular Surgery

## 2014-09-10 ENCOUNTER — Ambulatory Visit (HOSPITAL_COMMUNITY)
Admission: RE | Admit: 2014-09-10 | Discharge: 2014-09-10 | Disposition: A | Discharge status: Home / Self Care | Payer: Medicare Other | Source: Ambulatory Visit | Attending: Vascular Surgery | Admitting: Vascular Surgery

## 2014-09-10 ENCOUNTER — Other Ambulatory Visit: Payer: Self-pay

## 2014-09-10 VITALS — BP 138/94 | HR 62 | Temp 98.1°F | Resp 14 | Ht 72.0 in | Wt 207.0 lb

## 2014-09-10 DIAGNOSIS — I6521 Occlusion and stenosis of right carotid artery: Secondary | ICD-10-CM

## 2014-09-10 NOTE — Progress Notes (Signed)
[facility] [place] HISTORY AND PHYSICAL   History of Present Illness:  Patient is a 74 y.o. year old male who presents for evaluation of asymptomatic carotid stenosis. Patient had a carotid duplex scan recently which showed greater than 70% right internal carotid artery stenosis. The carotid duplex was performed in follow-up after workup for a nodule in the left neck and calcification of the carotids was seen on CT scan. The patient denies any prior symptoms of TIA amaurosis or stroke. He is currently on aspirin. He is a former smoker but quit 40 years ago. He also has a history of elevated cholesterol but has been intolerant of cholesterol lowering medications in the past. He states that they increased his liver enzymes or gave him anxiety. Other medical problems include hypertension. He did have a cardiac stress test 3-4 years ago which she said was normal. He is able to climb several flights of stairs without becoming short of breath. He denies chest pain. 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  Past Medical History  Diagnosis Date  . Hyperlipidemia   . Obesity   . Hypertension   . Carotid artery occlusion   . Arthritis     Past Surgical History  Procedure Laterality Date  . Spine surgery      C-7 disk surgery  by Dr. Darreld Mclean     Social History History  Substance Use Topics  . Smoking status: Former Smoker    Types: Cigarettes    Quit date: 06/06/1975  . Smokeless tobacco: Not on file  . Alcohol Use: No    Family History Family History  Problem Relation Age of Onset  . Hyperlipidemia Father   . Arthritis Sister     Allergies  Allergies  Allergen Reactions  . Sulfa Antibiotics Other (See Comments)  . Amoxicillin   . Statins     Pt is intolerant-muscle aches and weakness     Current Outpatient Prescriptions  Medication Sig Dispense Refill  . aspirin 81 MG tablet Take 81 mg by mouth daily.      . Flaxseed, Linseed,  (FLAX SEED OIL) 1000 MG CAPS Take by mouth. daily    . fosinopril (MONOPRIL) 40 MG tablet Take 1 tablet (40 mg total) by mouth daily. 90 tablet 3  . KRILL OIL PO Take by mouth.    . Red Yeast Rice 600 MG TABS Take by mouth. Daily    . lovastatin (MEVACOR) 20 MG tablet TAKE ONLY ON MON, WED, AND FRI'S (Patient not taking: Reported on 07/22/2014) 90 tablet 3   No current facility-administered medications for this visit.    ROS:   General:  No weight loss, Fever, chills  HEENT: No recent headaches, no nasal bleeding, no visual changes, no sore throat  Neurologic: No dizziness, blackouts, seizures. No recent symptoms of stroke or mini- stroke. No recent episodes of slurred speech, or temporary blindness.  Cardiac: No recent episodes of chest pain/pressure, no shortness of breath at rest.  No shortness of breath with exertion.  Denies history of atrial fibrillation or irregular heartbeat  Vascular: No history of rest pain in feet.  No history of claudication.  No history of non-healing ulcer, No history of DVT   Pulmonary: No home oxygen, no productive cough, no hemoptysis,  No asthma or wheezing  Musculoskeletal:  [ ]  Arthritis, [ ]  Low back pain,  [ ]  Joint pain  Hematologic:No history of hypercoagulable state.  No history of easy bleeding.  No history of anemia  Gastrointestinal: No hematochezia or melena,  No gastroesophageal reflux, no trouble swallowing  Urinary:  chronic Kidney disease,  on HD -  MWF or  TTHS,  Burning with urination,  Frequent urination,  Difficulty urinating;   Skin: No rashes  Psychological: No history of anxiety,  No history of depression   Physical Examination  Filed Vitals:   09/10/14 1023 09/10/14 1027  BP: 139/90 138/94  Pulse: 60 62  Temp: 98.1 F (36.7 C)   TempSrc: Oral   Resp: 14   Height: 6' (1.829 m)   Weight: 207 lb (93.895 kg)   SpO2: 96%     Body mass index is 28.07 kg/(m^2).  General:  Alert and oriented, no  acute distress HEENT: Normal Neck: No bruit or JVD Pulmonary: Clear to auscultation bilaterally Cardiac: Regular Rate and Rhythm without murmur Abdomen: Soft, non-tender, non-distended, no mass Skin: No rash Extremity Pulses:  2+ radial, brachial, femoral, dorsalis pedis, posterior tibial pulses bilaterally Musculoskeletal: No deformity or edema  Neurologic: Upper and lower extremity motor 5/5 and symmetric  DATA:  Patient repeat carotid duplex exam today which shows a fairly lengthy right internal carotid artery stenosis approaching 80% with normal carotid past the level of stenosis. Carotid bifurcation was mid hyoid. Previous carotid duplex scan showed less than 50% left internal carotid artery stenosis.   ASSESSMENT:  Patient with asymptomatic high-grade greater than 70% and approaching 80% stenosis of the right internal carotid artery.   PLAN:  Right carotid endarterectomy on Tuesday, 09/15/2014. Risks benefits possible complications and procedure details including but limited to bleeding infection myocardial events stroke risk of 1-2% cranial nerve injury risk of 10-15% were explained to the patient today. He understands and agrees to proceed. 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 I do not believe he needs cardiac risk stratification since he is very active overall and has good exercise tolerance and no history of chest pain or shortness of breath.  Fabienne Bruns, MD [facility] [place] Office: [phone] Pager: [phone]

## 2014-09-11 ENCOUNTER — Other Ambulatory Visit: Payer: Self-pay

## 2014-09-11 ENCOUNTER — Encounter (HOSPITAL_COMMUNITY)
Admission: RE | Admit: 2014-09-11 | Discharge: 2014-09-11 | Disposition: A | Discharge status: Home / Self Care | Payer: Medicare Other | Source: Ambulatory Visit | Attending: Vascular Surgery | Admitting: Vascular Surgery

## 2014-09-11 ENCOUNTER — Encounter (HOSPITAL_COMMUNITY): Payer: Self-pay

## 2014-09-11 DIAGNOSIS — Z01812 Encounter for preprocedural laboratory examination: Secondary | ICD-10-CM | POA: Insufficient documentation

## 2014-09-11 DIAGNOSIS — R9431 Abnormal electrocardiogram [ECG] [EKG]: Secondary | ICD-10-CM | POA: Insufficient documentation

## 2014-09-11 DIAGNOSIS — I6521 Occlusion and stenosis of right carotid artery: Secondary | ICD-10-CM | POA: Diagnosis not present

## 2014-09-11 DIAGNOSIS — I10 Essential (primary) hypertension: Secondary | ICD-10-CM | POA: Diagnosis not present

## 2014-09-11 DIAGNOSIS — Z0183 Encounter for blood typing: Secondary | ICD-10-CM | POA: Insufficient documentation

## 2014-09-11 HISTORY — DX: Personal history of pneumonia (recurrent): Z87.01

## 2014-09-11 HISTORY — DX: Complete loss of teeth, unspecified cause, unspecified class: K08.109

## 2014-09-11 HISTORY — DX: Personal history of other diseases of the respiratory system: Z87.09

## 2014-09-11 HISTORY — DX: Unspecified hearing loss, unspecified ear: H91.90

## 2014-09-11 HISTORY — DX: Other specified postprocedural states: Z98.890

## 2014-09-11 HISTORY — DX: Nausea with vomiting, unspecified: R11.2

## 2014-09-11 HISTORY — DX: Presence of dental prosthetic device (complete) (partial): Z97.2

## 2014-09-11 HISTORY — DX: Personal history of urinary calculi: Z87.442

## 2014-09-11 LAB — URINALYSIS, ROUTINE W REFLEX MICROSCOPIC
Bilirubin Urine: NEGATIVE
Glucose, UA: NEGATIVE mg/dL
HGB URINE DIPSTICK: NEGATIVE
Ketones, ur: NEGATIVE mg/dL
LEUKOCYTES UA: NEGATIVE
Nitrite: NEGATIVE
PH: 7 (ref 5.0–8.0)
PROTEIN: NEGATIVE mg/dL
Specific Gravity, Urine: 1.02 (ref 1.005–1.030)
UROBILINOGEN UA: 0.2 mg/dL (ref 0.0–1.0)

## 2014-09-11 LAB — COMPREHENSIVE METABOLIC PANEL
ALK PHOS: 55 U/L (ref 38–126)
ALT: 21 U/L (ref 17–63)
AST: 20 U/L (ref 15–41)
Albumin: 4 g/dL (ref 3.5–5.0)
Anion gap: 7 (ref 5–15)
BUN: 14 mg/dL (ref 6–20)
CALCIUM: 9.9 mg/dL (ref 8.9–10.3)
CO2: 26 mmol/L (ref 22–32)
Chloride: 106 mmol/L (ref 101–111)
Creatinine, Ser: 0.83 mg/dL (ref 0.61–1.24)
GFR calc non Af Amer: >60 mL/min (ref >60)
Glucose, Bld: 102 mg/dL — ABNORMAL HIGH (ref 65–99)
Potassium: 4.1 mmol/L (ref 3.5–5.1)
Sodium: 139 mmol/L (ref 135–145)
Total Bilirubin: 0.6 mg/dL (ref 0.3–1.2)
Total Protein: 7 g/dL (ref 6.5–8.1)

## 2014-09-11 LAB — SURGICAL PCR SCREEN
MRSA, PCR: NEGATIVE
STAPHYLOCOCCUS AUREUS: NEGATIVE

## 2014-09-11 LAB — CBC
HCT: 41.9 % (ref 39.0–52.0)
Hemoglobin: 14.6 g/dL (ref 13.0–17.0)
MCH: 31.2 pg (ref 26.0–34.0)
MCHC: 34.8 g/dL (ref 30.0–36.0)
MCV: 89.5 fL (ref 78.0–100.0)
Platelets: 215 10*3/uL (ref 150–400)
RBC: 4.68 MIL/uL (ref 4.22–5.81)
RDW: 13.2 % (ref 11.5–15.5)
WBC: 5 10*3/uL (ref 4.0–10.5)

## 2014-09-11 LAB — APTT: APTT: 26 s (ref 24–37)

## 2014-09-11 LAB — ABO/RH: ABO/RH(D): A POS

## 2014-09-11 LAB — TYPE AND SCREEN
ABO/RH(D): A POS
Antibody Screen: NEGATIVE

## 2014-09-11 LAB — PROTIME-INR
INR: 0.98 (ref 0.00–1.49)
PROTHROMBIN TIME: 13.2 s (ref 11.6–15.2)

## 2014-09-11 MED ORDER — CHLORHEXIDINE GLUCONATE CLOTH 2 % EX PADS: 6.0000 | MEDICATED_PAD | Freq: Once | CUTANEOUS | Status: DC

## 2014-09-11 NOTE — Progress Notes (Signed)
   09/11/14 1042  OBSTRUCTIVE SLEEP APNEA  Have you ever been diagnosed with sleep apnea through a sleep study? No  Do you snore loudly (loud enough to be heard through closed doors)?  0  Do you often feel tired, fatigued, or sleepy during the daytime? 0  Has anyone observed you stop breathing during your sleep? 0  Do you have, or are you being treated for high blood pressure? 1  BMI more than 35 kg/m2? 0  Age over 73 years old? 1  Neck circumference greater than 40 cm/16 inches? 1  Gender: 1  Obstructive Sleep Apnea Score 4

## 2014-09-11 NOTE — Pre-Procedure Instructions (Signed)
    Eric Hahn  09/11/2014      [id number] - [place], [place] - [address] AT [facility] [address] [place] [address] Phone: [phone] Fax: [phone]    Your procedure is scheduled on Tuesday, August 2nd, 2016 at 7:30 AM.  Report to [facility] Admitting at 5:30 A.M.  Call this number if you have problems the morning of surgery:  [phone]   Remember:  Do not eat food or drink liquids after midnight.   Take these medicines the morning of surgery with A SIP OF WATER: Aspirin.  Stop taking: NSAIDS, Ibuprofen, BC's, Goody's, Naproxen, Aleve, Fish Oil, all herbal medications and all vitamins.    Do not wear jewelry.  Do not wear lotions, powders, or colognes.  You may NOT wear deodorant.  Men may shave face and neck.  Do not bring valuables to the hospital.  [facility] is not responsible for any belongings or valuables.  Contacts, dentures or bridgework may not be worn into surgery.  Leave your suitcase in the car.  After surgery it may be brought to your room.  For patients admitted to the hospital, discharge time will be determined by your treatment team.  Patients discharged the day of surgery will not be allowed to drive home.   Special instructions:  See attached.   Please read over the following fact sheets that you were given. Pain Booklet, Coughing and Deep Breathing, Blood Transfusion Information, MRSA Information and Surgical Site Infection Prevention

## 2014-09-11 NOTE — Progress Notes (Signed)
Patient states that he had nausea/vomiting with first surgery but was given a patch to go behind his ear for the second surgery and had no problems.

## 2014-09-11 NOTE — Progress Notes (Signed)
PCP- Dr. Cleta Alberts Cardiologist - Denies EKG - 09/11/14 - Epic CXR - denies Echo- 06/06/14 - Epic Stress Test - 06/13/14 - Epic Cardiac Cath - denies

## 2014-09-14 MED ORDER — SODIUM CHLORIDE 0.9 % IV SOLN: INTRAVENOUS | Status: DC

## 2014-09-14 MED ORDER — VANCOMYCIN HCL IN DEXTROSE 1-5 GM/200ML-% IV SOLN
1000.0000 mg | INTRAVENOUS | Status: AC
  Administered 2014-09-15: 1000 mg via INTRAVENOUS
  Filled 2014-09-14: qty 200

## 2014-09-15 ENCOUNTER — Encounter (HOSPITAL_COMMUNITY): Payer: Self-pay | Admitting: General Practice

## 2014-09-15 ENCOUNTER — Encounter (HOSPITAL_COMMUNITY)
Admission: RE | Disposition: A | Discharge status: Home / Self Care | Payer: Self-pay | Source: Ambulatory Visit | Attending: Vascular Surgery

## 2014-09-15 ENCOUNTER — Inpatient Hospital Stay (HOSPITAL_COMMUNITY): Payer: Medicare Other | Admitting: Certified Registered Nurse Anesthetist

## 2014-09-15 ENCOUNTER — Telehealth: Payer: Self-pay | Admitting: Vascular Surgery

## 2014-09-15 ENCOUNTER — Inpatient Hospital Stay (HOSPITAL_COMMUNITY)
Admission: RE | Admit: 2014-09-15 | Discharge: 2014-09-16 | DRG: 039 | Disposition: A | Discharge status: Home / Self Care | Payer: Medicare Other | Source: Ambulatory Visit | Attending: Vascular Surgery | Admitting: Vascular Surgery

## 2014-09-15 DIAGNOSIS — I10 Essential (primary) hypertension: Secondary | ICD-10-CM | POA: Diagnosis present

## 2014-09-15 DIAGNOSIS — E669 Obesity, unspecified: Secondary | ICD-10-CM | POA: Diagnosis present

## 2014-09-15 DIAGNOSIS — Z6828 Body mass index (BMI) 28.0-28.9, adult: Secondary | ICD-10-CM

## 2014-09-15 DIAGNOSIS — M1990 Unspecified osteoarthritis, unspecified site: Secondary | ICD-10-CM | POA: Diagnosis present

## 2014-09-15 DIAGNOSIS — Z882 Allergy status to sulfonamides status: Secondary | ICD-10-CM

## 2014-09-15 DIAGNOSIS — H9190 Unspecified hearing loss, unspecified ear: Secondary | ICD-10-CM | POA: Diagnosis present

## 2014-09-15 DIAGNOSIS — Z880 Allergy status to penicillin: Secondary | ICD-10-CM

## 2014-09-15 DIAGNOSIS — Z87891 Personal history of nicotine dependence: Secondary | ICD-10-CM

## 2014-09-15 DIAGNOSIS — I9581 Postprocedural hypotension: Secondary | ICD-10-CM | POA: Diagnosis not present

## 2014-09-15 DIAGNOSIS — I6521 Occlusion and stenosis of right carotid artery: Principal | ICD-10-CM | POA: Diagnosis present

## 2014-09-15 DIAGNOSIS — R110 Nausea: Secondary | ICD-10-CM | POA: Diagnosis not present

## 2014-09-15 DIAGNOSIS — Z79899 Other long term (current) drug therapy: Secondary | ICD-10-CM | POA: Diagnosis not present

## 2014-09-15 DIAGNOSIS — Z888 Allergy status to other drugs, medicaments and biological substances status: Secondary | ICD-10-CM

## 2014-09-15 DIAGNOSIS — Z7982 Long term (current) use of aspirin: Secondary | ICD-10-CM | POA: Diagnosis not present

## 2014-09-15 DIAGNOSIS — E785 Hyperlipidemia, unspecified: Secondary | ICD-10-CM | POA: Diagnosis present

## 2014-09-15 DIAGNOSIS — I6529 Occlusion and stenosis of unspecified carotid artery: Secondary | ICD-10-CM | POA: Diagnosis present

## 2014-09-15 HISTORY — PX: CAROTID ENDARTERECTOMY: SUR193

## 2014-09-15 HISTORY — PX: ENDARTERECTOMY: SHX5162

## 2014-09-15 LAB — CBC
HEMATOCRIT: 37.2 % — AB (ref 39.0–52.0)
Hemoglobin: 12.6 g/dL — ABNORMAL LOW (ref 13.0–17.0)
MCH: 30.4 pg (ref 26.0–34.0)
MCHC: 33.9 g/dL (ref 30.0–36.0)
MCV: 89.9 fL (ref 78.0–100.0)
Platelets: 181 10*3/uL (ref 150–400)
RBC: 4.14 MIL/uL — ABNORMAL LOW (ref 4.22–5.81)
RDW: 13 % (ref 11.5–15.5)
WBC: 8.1 10*3/uL (ref 4.0–10.5)

## 2014-09-15 LAB — CREATININE, SERUM
Creatinine, Ser: 0.92 mg/dL (ref 0.61–1.24)
GFR calc non Af Amer: >60 mL/min (ref >60)

## 2014-09-15 SURGERY — ENDARTERECTOMY, CAROTID
Anesthesia: General | Site: Neck | Laterality: Right

## 2014-09-15 MED ORDER — MORPHINE SULFATE 2 MG/ML IJ SOLN: 2.0000 mg | INTRAMUSCULAR | Status: DC | PRN

## 2014-09-15 MED ORDER — OXYCODONE HCL 5 MG PO TABS: 5.0000 mg | ORAL_TABLET | Freq: Once | ORAL | Status: DC | PRN

## 2014-09-15 MED ORDER — NEOSTIGMINE METHYLSULFATE 10 MG/10ML IV SOLN
INTRAVENOUS | Status: DC | PRN
  Administered 2014-09-15: 3 mg via INTRAVENOUS

## 2014-09-15 MED ORDER — 0.9 % SODIUM CHLORIDE (POUR BTL) OPTIME
TOPICAL | Status: DC | PRN
  Administered 2014-09-15: 2000 mL

## 2014-09-15 MED ORDER — HEPARIN SODIUM (PORCINE) 1000 UNIT/ML IJ SOLN
INTRAMUSCULAR | Status: AC
  Filled 2014-09-15: qty 2

## 2014-09-15 MED ORDER — PANTOPRAZOLE SODIUM 40 MG PO TBEC
40.0000 mg | DELAYED_RELEASE_TABLET | Freq: Every day | ORAL | Status: DC
  Administered 2014-09-16: 40 mg via ORAL
  Filled 2014-09-15: qty 1

## 2014-09-15 MED ORDER — PHENYLEPHRINE HCL 10 MG/ML IJ SOLN
10.0000 mg | INTRAVENOUS | Status: DC | PRN
  Administered 2014-09-15: 25 ug/min via INTRAVENOUS

## 2014-09-15 MED ORDER — FOSINOPRIL SODIUM 20 MG PO TABS: 40.0000 mg | ORAL_TABLET | Freq: Every day | ORAL | Status: DC

## 2014-09-15 MED ORDER — SODIUM CHLORIDE 0.9 % IV SOLN
INTRAVENOUS | Status: DC
  Administered 2014-09-15 – 2014-09-16 (×2): via INTRAVENOUS

## 2014-09-15 MED ORDER — POTASSIUM CHLORIDE CRYS ER 20 MEQ PO TBCR: 20.0000 meq | EXTENDED_RELEASE_TABLET | Freq: Every day | ORAL | Status: DC | PRN

## 2014-09-15 MED ORDER — ACETAMINOPHEN 650 MG RE SUPP: 325.0000 mg | RECTAL | Status: DC | PRN

## 2014-09-15 MED ORDER — PROPOFOL 10 MG/ML IV BOLUS
INTRAVENOUS | Status: AC
  Filled 2014-09-15: qty 20

## 2014-09-15 MED ORDER — PHENOL 1.4 % MT LIQD: 1.0000 | OROMUCOSAL | Status: DC | PRN

## 2014-09-15 MED ORDER — ENOXAPARIN SODIUM 40 MG/0.4ML ~~LOC~~ SOLN
40.0000 mg | SUBCUTANEOUS | Status: DC
  Administered 2014-09-16: 40 mg via SUBCUTANEOUS
  Filled 2014-09-15: qty 0.4

## 2014-09-15 MED ORDER — MAGNESIUM SULFATE 2 GM/50ML IV SOLN: 2.0000 g | Freq: Every day | INTRAVENOUS | Status: DC | PRN

## 2014-09-15 MED ORDER — FENTANYL CITRATE (PF) 250 MCG/5ML IJ SOLN
INTRAMUSCULAR | Status: AC
  Filled 2014-09-15: qty 5

## 2014-09-15 MED ORDER — LACTATED RINGERS IV SOLN
INTRAVENOUS | Status: DC | PRN
  Administered 2014-09-15 (×2): via INTRAVENOUS

## 2014-09-15 MED ORDER — ACETAMINOPHEN 325 MG PO TABS: 325.0000 mg | ORAL_TABLET | ORAL | Status: DC | PRN

## 2014-09-15 MED ORDER — ONDANSETRON HCL 4 MG/2ML IJ SOLN
4.0000 mg | Freq: Once | INTRAMUSCULAR | Status: AC | PRN
  Administered 2014-09-15: 4 mg via INTRAVENOUS

## 2014-09-15 MED ORDER — ASPIRIN EC 81 MG PO TBEC
81.0000 mg | DELAYED_RELEASE_TABLET | Freq: Every day | ORAL | Status: DC
  Administered 2014-09-16: 81 mg via ORAL
  Filled 2014-09-15: qty 1

## 2014-09-15 MED ORDER — SODIUM CHLORIDE 0.9 % IR SOLN
Status: DC | PRN
  Administered 2014-09-15: 500 mL

## 2014-09-15 MED ORDER — EPHEDRINE SULFATE 50 MG/ML IJ SOLN
INTRAMUSCULAR | Status: DC | PRN
  Administered 2014-09-15 (×3): 10 mg via INTRAVENOUS

## 2014-09-15 MED ORDER — BISACODYL 10 MG RE SUPP: 10.0000 mg | Freq: Every day | RECTAL | Status: DC | PRN

## 2014-09-15 MED ORDER — HYDRALAZINE HCL 20 MG/ML IJ SOLN: 5.0000 mg | INTRAMUSCULAR | Status: DC | PRN

## 2014-09-15 MED ORDER — PHENYLEPHRINE HCL 10 MG/ML IJ SOLN
INTRAMUSCULAR | Status: DC | PRN
  Administered 2014-09-15: 120 ug via INTRAVENOUS

## 2014-09-15 MED ORDER — DEXAMETHASONE SODIUM PHOSPHATE 10 MG/ML IJ SOLN
INTRAMUSCULAR | Status: DC | PRN
  Administered 2014-09-15: 10 mg via INTRAVENOUS

## 2014-09-15 MED ORDER — GUAIFENESIN-DM 100-10 MG/5ML PO SYRP
15.0000 mL | ORAL_SOLUTION | ORAL | Status: DC | PRN
Start: 2014-09-15 — End: 2014-09-16

## 2014-09-15 MED ORDER — DOPAMINE-DEXTROSE 1.6-5 MG/ML-% IV SOLN
INTRAVENOUS | Status: DC | PRN
  Administered 2014-09-15: 3 ug/kg/min via INTRAVENOUS

## 2014-09-15 MED ORDER — DOPAMINE-DEXTROSE 3.2-5 MG/ML-% IV SOLN
2.5000 ug/kg/min | INTRAVENOUS | Status: DC
  Administered 2014-09-15: 2.5 ug/kg/min via INTRAVENOUS
  Filled 2014-09-15: qty 250

## 2014-09-15 MED ORDER — FENTANYL CITRATE (PF) 100 MCG/2ML IJ SOLN
INTRAMUSCULAR | Status: DC | PRN
  Administered 2014-09-15 (×5): 50 ug via INTRAVENOUS
  Administered 2014-09-15: 100 ug via INTRAVENOUS
  Administered 2014-09-15 (×4): 50 ug via INTRAVENOUS

## 2014-09-15 MED ORDER — OXYCODONE HCL 5 MG PO TABS: 5.0000 mg | ORAL_TABLET | ORAL | Status: DC | PRN

## 2014-09-15 MED ORDER — DOCUSATE SODIUM 100 MG PO CAPS
100.0000 mg | ORAL_CAPSULE | Freq: Every day | ORAL | Status: DC
  Administered 2014-09-16: 100 mg via ORAL
  Filled 2014-09-15: qty 1

## 2014-09-15 MED ORDER — ONDANSETRON HCL 4 MG/2ML IJ SOLN
4.0000 mg | Freq: Four times a day (QID) | INTRAMUSCULAR | Status: DC | PRN
  Administered 2014-09-15: 4 mg via INTRAVENOUS
  Filled 2014-09-15: qty 2

## 2014-09-15 MED ORDER — PROTAMINE SULFATE 10 MG/ML IV SOLN
INTRAVENOUS | Status: DC | PRN
  Administered 2014-09-15 (×2): 50 mg via INTRAVENOUS

## 2014-09-15 MED ORDER — LISINOPRIL 40 MG PO TABS
40.0000 mg | ORAL_TABLET | Freq: Every day | ORAL | Status: DC
  Filled 2014-09-15 (×2): qty 1

## 2014-09-15 MED ORDER — OXYCODONE HCL 5 MG PO TABS: 5.0000 mg | ORAL_TABLET | Freq: Four times a day (QID) | ORAL | Status: DC | PRN

## 2014-09-15 MED ORDER — ONDANSETRON HCL 4 MG/2ML IJ SOLN
INTRAMUSCULAR | Status: AC
  Filled 2014-09-15: qty 2

## 2014-09-15 MED ORDER — PROPOFOL 10 MG/ML IV BOLUS
INTRAVENOUS | Status: DC | PRN
  Administered 2014-09-15: 50 mg via INTRAVENOUS
  Administered 2014-09-15: 150 mg via INTRAVENOUS

## 2014-09-15 MED ORDER — ROCURONIUM BROMIDE 100 MG/10ML IV SOLN
INTRAVENOUS | Status: DC | PRN
  Administered 2014-09-15: 50 mg via INTRAVENOUS
  Administered 2014-09-15 (×2): 10 mg via INTRAVENOUS

## 2014-09-15 MED ORDER — ONDANSETRON HCL 4 MG/2ML IJ SOLN
INTRAMUSCULAR | Status: DC | PRN
  Administered 2014-09-15: 4 mg via INTRAVENOUS

## 2014-09-15 MED ORDER — OXYCODONE HCL 5 MG/5ML PO SOLN: 5.0000 mg | Freq: Once | ORAL | Status: DC | PRN

## 2014-09-15 MED ORDER — DOPAMINE-DEXTROSE 3.2-5 MG/ML-% IV SOLN
INTRAVENOUS | Status: AC
  Filled 2014-09-15: qty 250

## 2014-09-15 MED ORDER — GLYCOPYRROLATE 0.2 MG/ML IJ SOLN
INTRAMUSCULAR | Status: DC | PRN
  Administered 2014-09-15: 0.4 mg via INTRAVENOUS

## 2014-09-15 MED ORDER — SODIUM CHLORIDE 0.9 % IV SOLN: 500.0000 mL | Freq: Once | INTRAVENOUS | Status: AC | PRN

## 2014-09-15 MED ORDER — VANCOMYCIN HCL IN DEXTROSE 1-5 GM/200ML-% IV SOLN
1000.0000 mg | Freq: Two times a day (BID) | INTRAVENOUS | Status: AC
  Administered 2014-09-15 – 2014-09-16 (×2): 1000 mg via INTRAVENOUS
  Filled 2014-09-15 (×2): qty 200

## 2014-09-15 MED ORDER — LIDOCAINE HCL (PF) 1 % IJ SOLN
INTRAMUSCULAR | Status: DC | PRN
  Administered 2014-09-15: 30 mL

## 2014-09-15 MED ORDER — FENTANYL CITRATE (PF) 100 MCG/2ML IJ SOLN: 25.0000 ug | INTRAMUSCULAR | Status: DC | PRN

## 2014-09-15 MED ORDER — LIDOCAINE HCL (CARDIAC) 20 MG/ML IV SOLN
INTRAVENOUS | Status: DC | PRN
  Administered 2014-09-15: 50 mg via INTRATRACHEAL
  Administered 2014-09-15: 50 mg via INTRAVENOUS

## 2014-09-15 MED ORDER — METOPROLOL TARTRATE 1 MG/ML IV SOLN: 2.0000 mg | INTRAVENOUS | Status: DC | PRN

## 2014-09-15 MED ORDER — THROMBIN 20000 UNITS EX SOLR
CUTANEOUS | Status: AC
  Filled 2014-09-15: qty 20000

## 2014-09-15 MED ORDER — HEPARIN SODIUM (PORCINE) 1000 UNIT/ML IJ SOLN
INTRAMUSCULAR | Status: DC | PRN
  Administered 2014-09-15: 9000 [IU] via INTRAVENOUS
  Administered 2014-09-15: 5000 [IU] via INTRAVENOUS

## 2014-09-15 MED ORDER — MIDAZOLAM HCL 2 MG/2ML IJ SOLN
INTRAMUSCULAR | Status: AC
  Filled 2014-09-15: qty 2

## 2014-09-15 MED ORDER — ALUM & MAG HYDROXIDE-SIMETH 200-200-20 MG/5ML PO SUSP: 15.0000 mL | ORAL | Status: DC | PRN

## 2014-09-15 MED ORDER — LIDOCAINE HCL (PF) 1 % IJ SOLN
INTRAMUSCULAR | Status: AC
  Filled 2014-09-15: qty 30

## 2014-09-15 SURGICAL SUPPLY — 47 items
CANISTER SUCTION 2500CC (MISCELLANEOUS) ×2 IMPLANT
CANNULA VESSEL 3MM 2 BLNT TIP (CANNULA) ×2 IMPLANT
CATH ROBINSON RED A/P 18FR (CATHETERS) ×2 IMPLANT
CLIP TI MEDIUM 6 (CLIP) ×2 IMPLANT
CLIP TI WIDE RED SMALL 6 (CLIP) ×2 IMPLANT
CRADLE DONUT ADULT HEAD (MISCELLANEOUS) ×2 IMPLANT
DECANTER SPIKE VIAL GLASS SM (MISCELLANEOUS) IMPLANT
DRAIN HEMOVAC 1/8 X 5 (WOUND CARE) IMPLANT
ELECT REM PT RETURN 9FT ADLT (ELECTROSURGICAL) ×2
ELECTRODE REM PT RTRN 9FT ADLT (ELECTROSURGICAL) ×1 IMPLANT
EVACUATOR SILICONE 100CC (DRAIN) IMPLANT
GAUZE SPONGE 4X4 12PLY STRL (GAUZE/BANDAGES/DRESSINGS) ×2 IMPLANT
GEL ULTRASOUND 20GR AQUASONIC (MISCELLANEOUS) IMPLANT
GLOVE BIO SURGEON STRL SZ 6.5 (GLOVE) ×2 IMPLANT
GLOVE BIO SURGEON STRL SZ7 (GLOVE) ×2 IMPLANT
GLOVE BIO SURGEON STRL SZ7.5 (GLOVE) ×2 IMPLANT
GLOVE BIOGEL PI IND STRL 6.5 (GLOVE) IMPLANT
GLOVE BIOGEL PI INDICATOR 6.5 (GLOVE) ×2
GLOVE SURG SS PI 7.0 STRL IVOR (GLOVE) ×1 IMPLANT
GOWN STRL REUS W/ TWL LRG LVL3 (GOWN DISPOSABLE) ×3 IMPLANT
GOWN STRL REUS W/TWL LRG LVL3 (GOWN DISPOSABLE) ×10
KIT BASIN OR (CUSTOM PROCEDURE TRAY) ×2 IMPLANT
KIT ROOM TURNOVER OR (KITS) ×2 IMPLANT
LIQUID BAND (GAUZE/BANDAGES/DRESSINGS) ×2 IMPLANT
LOOP VESSEL MINI RED (MISCELLANEOUS) IMPLANT
NDL HYPO 25GX1X1/2 BEV (NEEDLE) IMPLANT
NEEDLE HYPO 25GX1X1/2 BEV (NEEDLE) ×2 IMPLANT
NS IRRIG 1000ML POUR BTL (IV SOLUTION) ×4 IMPLANT
PACK CAROTID (CUSTOM PROCEDURE TRAY) ×2 IMPLANT
PAD ARMBOARD 7.5X6 YLW CONV (MISCELLANEOUS) ×4 IMPLANT
PATCH HEMASHIELD 8X75 (Vascular Products) ×1 IMPLANT
SHUNT CAROTID BYPASS 10 (VASCULAR PRODUCTS) ×1 IMPLANT
SHUNT CAROTID BYPASS 12FRX15.5 (VASCULAR PRODUCTS) IMPLANT
SPONGE INTESTINAL PEANUT (DISPOSABLE) ×1 IMPLANT
SPONGE SURGIFOAM ABS GEL 100 (HEMOSTASIS) IMPLANT
SUT ETHILON 3 0 PS 1 (SUTURE) IMPLANT
SUT PROLENE 6 0 CC (SUTURE) ×3 IMPLANT
SUT PROLENE 7 0 BV 1 (SUTURE) IMPLANT
SUT PROLENE 7 0 BV1 MDA (SUTURE) ×1 IMPLANT
SUT SILK 3 0 (SUTURE) ×2
SUT SILK 3-0 18XBRD TIE 12 (SUTURE) IMPLANT
SUT VIC AB 3-0 SH 27 (SUTURE) ×2
SUT VIC AB 3-0 SH 27X BRD (SUTURE) ×1 IMPLANT
SUT VICRYL 4-0 PS2 18IN ABS (SUTURE) ×2 IMPLANT
SYR CONTROL 10ML LL (SYRINGE) ×1 IMPLANT
TRAY FOLEY CATH 16FRSI W/METER (SET/KITS/TRAYS/PACK) ×1 IMPLANT
WATER STERILE IRR 1000ML POUR (IV SOLUTION) ×2 IMPLANT

## 2014-09-15 NOTE — H&P (View-Only) (Signed)
[facility] [place] HISTORY AND PHYSICAL   History of Present Illness:  Patient is a 74 y.o. year old male who presents for evaluation of asymptomatic carotid stenosis. Patient had a carotid duplex scan recently which showed greater than 70% right internal carotid artery stenosis. The carotid duplex was performed in follow-up after workup for a nodule in the left neck and calcification of the carotids was seen on CT scan. The patient denies any prior symptoms of TIA amaurosis or stroke. He is currently on aspirin. He is a former smoker but quit 40 years ago. He also has a history of elevated cholesterol but has been intolerant of cholesterol lowering medications in the past. He states that they increased his liver enzymes or gave him anxiety. Other medical problems include hypertension. He did have a cardiac stress test 3-4 years ago which she said was normal. He is able to climb several flights of stairs without becoming short of breath. He denies chest pain. 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  Past Medical History  Diagnosis Date  . Hyperlipidemia   . Obesity   . Hypertension   . Carotid artery occlusion   . Arthritis     Past Surgical History  Procedure Laterality Date  . Spine surgery      C-7 disk surgery  by Dr. Darreld Mclean     Social History History  Substance Use Topics  . Smoking status: Former Smoker    Types: Cigarettes    Quit date: 06/06/1975  . Smokeless tobacco: Not on file  . Alcohol Use: No    Family History Family History  Problem Relation Age of Onset  . Hyperlipidemia Father   . Arthritis Sister     Allergies  Allergies  Allergen Reactions  . Sulfa Antibiotics Other (See Comments)  . Amoxicillin   . Statins     Pt is intolerant-muscle aches and weakness     Current Outpatient Prescriptions  Medication Sig Dispense Refill  . aspirin 81 MG tablet Take 81 mg by mouth daily.      . Flaxseed, Linseed,  (FLAX SEED OIL) 1000 MG CAPS Take by mouth. daily    . fosinopril (MONOPRIL) 40 MG tablet Take 1 tablet (40 mg total) by mouth daily. 90 tablet 3  . KRILL OIL PO Take by mouth.    . Red Yeast Rice 600 MG TABS Take by mouth. Daily    . lovastatin (MEVACOR) 20 MG tablet TAKE ONLY ON MON, WED, AND FRI'S (Patient not taking: Reported on 07/22/2014) 90 tablet 3   No current facility-administered medications for this visit.    ROS:   General:  No weight loss, Fever, chills  HEENT: No recent headaches, no nasal bleeding, no visual changes, no sore throat  Neurologic: No dizziness, blackouts, seizures. No recent symptoms of stroke or mini- stroke. No recent episodes of slurred speech, or temporary blindness.  Cardiac: No recent episodes of chest pain/pressure, no shortness of breath at rest.  No shortness of breath with exertion.  Denies history of atrial fibrillation or irregular heartbeat  Vascular: No history of rest pain in feet.  No history of claudication.  No history of non-healing ulcer, No history of DVT   Pulmonary: No home oxygen, no productive cough, no hemoptysis,  No asthma or wheezing  Musculoskeletal:  [ ]  Arthritis, [ ]  Low back pain,  [ ]  Joint pain  Hematologic:No history of hypercoagulable state.  No history of easy bleeding.  No history of anemia  Gastrointestinal: No hematochezia or melena,  No gastroesophageal reflux, no trouble swallowing  Urinary:  chronic Kidney disease,  on HD -  MWF or  TTHS,  Burning with urination,  Frequent urination,  Difficulty urinating;   Skin: No rashes  Psychological: No history of anxiety,  No history of depression   Physical Examination  Filed Vitals:   09/10/14 1023 09/10/14 1027  BP: 139/90 138/94  Pulse: 60 62  Temp: 98.1 F (36.7 C)   TempSrc: Oral   Resp: 14   Height: 6' (1.829 m)   Weight: 207 lb (93.895 kg)   SpO2: 96%     Body mass index is 28.07 kg/(m^2).  General:  Alert and oriented, no  acute distress HEENT: Normal Neck: No bruit or JVD Pulmonary: Clear to auscultation bilaterally Cardiac: Regular Rate and Rhythm without murmur Abdomen: Soft, non-tender, non-distended, no mass Skin: No rash Extremity Pulses:  2+ radial, brachial, femoral, dorsalis pedis, posterior tibial pulses bilaterally Musculoskeletal: No deformity or edema  Neurologic: Upper and lower extremity motor 5/5 and symmetric  DATA:  Patient repeat carotid duplex exam today which shows a fairly lengthy right internal carotid artery stenosis approaching 80% with normal carotid past the level of stenosis. Carotid bifurcation was mid hyoid. Previous carotid duplex scan showed less than 50% left internal carotid artery stenosis.   ASSESSMENT:  Patient with asymptomatic high-grade greater than 70% and approaching 80% stenosis of the right internal carotid artery.   PLAN:  Right carotid endarterectomy on Tuesday, 09/15/2014. Risks benefits possible complications and procedure details including but limited to bleeding infection myocardial events stroke risk of 1-2% cranial nerve injury risk of 10-15% were explained to the patient today. He understands and agrees to proceed. 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 I do not believe he needs cardiac risk stratification since he is very active overall and has good exercise tolerance and no history of chest pain or shortness of breath.  Fabienne Bruns, MD [facility] [place] Office: [phone] Pager: [phone]

## 2014-09-15 NOTE — Telephone Encounter (Signed)
LM for pt re appt, dpm °

## 2014-09-15 NOTE — Transfer of Care (Signed)
Immediate Anesthesia Transfer of Care Note  Patient: Eric Hahn  Procedure(s) Performed: Procedure(s): RIGHT CAROTID ENDARTERECTOMY WITH HEMASHIELD PATCH ANGIOPLASTY  (Right)  Patient Location: PACU  Anesthesia Type:General  Level of Consciousness: awake, alert  and pateint uncooperative - some seeming issues with emergence delerium.  Combative at times. Pulling off o2 mask and [place] but breathing spontaneously   Airway & Oxygen Therapy: Patient Spontanous Breathing  Post-op Assessment: Report given to RN, Post -op Vital signs reviewed and stable, Patient moving all extremities and Patient moving all extremities X 4  Post vital signs: Reviewed and stable  Last Vitals:  Filed Vitals:   09/15/14 0555  BP: 134/84  Pulse: 54  Temp: 36.7 C  Resp: 18    Complications: No apparent anesthesia complications

## 2014-09-15 NOTE — Progress Notes (Signed)
  [facility] Day of Surgery Note  Subjective:  Patient seen in PACU. Having some nausea. Otherwise no complaints.   Filed Vitals:   09/15/14 1200  BP: 97/46  Pulse: 51  Temp:   Resp: 22    Right neck incision c/d/i without hematoma Moving all extremities equally. Tongue midline. Smile symmetric.   Assessment/Plan:  This is a 74 y.o. male who is s/p right carotid endarterectomy  Stable post-op.  Neuro intact. D/c foley in am.  To 3S when bed available.    Maris Berger, [place] Pager: [phone] 09/15/2014 12:17 PM

## 2014-09-15 NOTE — Progress Notes (Signed)
BP 80's/40's after 500cc NS Bolus X2. HR 40-50. Pt resting quietly; neuro intact; asymptomatic.  P.A. Notified.  Dopamine drip initiated as ordered

## 2014-09-15 NOTE — Anesthesia Procedure Notes (Signed)
Procedure Name: Intubation Date/Time: 09/15/2014 7:44 AM Performed by: Shirlyn Goltz Pre-anesthesia Checklist: Patient identified, Emergency Drugs available, Suction available and Patient being monitored Patient Re-evaluated:Patient Re-evaluated prior to inductionOxygen Delivery Method: Circle system utilized Preoxygenation: Pre-oxygenation with 100% oxygen Intubation Type: IV induction Ventilation: Mask ventilation without difficulty, Oral airway inserted - appropriate to patient size and Two handed mask ventilation required Laryngoscope Size: Mac and 4 Grade View: Grade I Tube type: Oral Tube size: 7.0 mm Number of attempts: 1 Airway Equipment and Method: Stylet and LTA kit utilized Placement Confirmation: ETT inserted through vocal cords under direct vision,  positive ETCO2 and breath sounds checked- equal and bilateral Secured at: 21 cm Tube secured with: Tape Dental Injury: Teeth and Oropharynx as per pre-operative assessment

## 2014-09-15 NOTE — Telephone Encounter (Signed)
-----   Message from Sharee Pimple, RN sent at 09/15/2014 11:13 AM EDT ----- Regarding: Schedule   ----- Message -----    From: Raymond Gurney, PA-C    Sent: 09/15/2014  11:05 AM      To: [facility] Charge Pool  S/p right CEA 09/15/14  F/u with Dr. Darrick Penna in 2 weeks  Thanks Selena Batten

## 2014-09-15 NOTE — Interval H&P Note (Signed)
History and Physical Interval Note:  09/15/2014 7:26 AM  Eric Hahn  has presented today for surgery, with the diagnosis of  Right carotid artery stenosis I65.21  The various methods of treatment have been discussed with the patient and family. After consideration of risks, benefits and other options for treatment, the patient has consented to  Procedure(s): ENDARTERECTOMY CAROTID (Right) as a surgical intervention .  The patient's history has been reviewed, patient examined, no change in status, stable for surgery.  I have reviewed the patient's chart and labs.  Questions were answered to the patient's satisfaction.     Fabienne Bruns

## 2014-09-15 NOTE — Plan of Care (Signed)
Problem: Consults Goal: Diagnosis CEA/CES/AAA Stent Carotid Endarterectomy (CEA)     

## 2014-09-15 NOTE — Progress Notes (Signed)
Patient transferred from PACU via bed on tele with Dopamine gtt at infusing. Oriented to unit and room, instructed on callbell and placed at side. Family at bedside.

## 2014-09-15 NOTE — Anesthesia Preprocedure Evaluation (Signed)
Anesthesia Evaluation  Patient identified by MRN, date of birth, ID band Patient awake    Reviewed: Allergy & Precautions, NPO status , Patient's Chart, lab work & pertinent test results  Airway Mallampati: II  TM Distance: >3 FB Neck ROM: Full    Dental  (+) Edentulous Upper, Edentulous Lower   Pulmonary former smoker,  breath sounds clear to auscultation        Cardiovascular hypertension, Rhythm:Regular Rate:Normal     Neuro/Psych    GI/Hepatic   Endo/Other    Renal/GU      Musculoskeletal   Abdominal   Peds  Hematology   Anesthesia Other Findings   Reproductive/Obstetrics                             Anesthesia Physical Anesthesia Plan  ASA: II  Anesthesia Plan: General   Post-op Pain Management:    Induction: Intravenous  Airway Management Planned: Oral ETT  Additional Equipment:   Intra-op Plan:   Post-operative Plan: Extubation in OR  Informed Consent: I have reviewed the patients History and Physical, chart, labs and discussed the procedure including the risks, benefits and alternatives for the proposed anesthesia with the patient or authorized representative who has indicated his/her understanding and acceptance.   Dental advisory given  Plan Discussed with: CRNA and Anesthesiologist  Anesthesia Plan Comments: (Asymptomatic R. Carotid stenosis Hypertension H/O post-op N/V H/O post-op urinary retention  Plan GA with oral ETT  Kipp Brood)        Anesthesia Quick Evaluation

## 2014-09-15 NOTE — Op Note (Signed)
Procedure: Right carotid endarterectomy  Preoperative diagnosis: High-grade asymptomatic right internal carotid artery stenosis  Postoperative diagnosis: Same  Anesthesia General  Asst.: Karsten Ro, [facility]  Operative findings: #1 greater than 80% right internal carotid stenosis                                                             #2 Dacron patch           #3 10 Fr shunt  Operative details: After obtaining informed consent, the patient was taken to the operating room. The patient was placed in a supine position on the operating room table. After induction of general anesthesia, the patient's entire neck and chest was prepped and draped in the usual sterile fashion. An oblique incision was made on the right aspect of the patient's neck anterior to the border the right sternocleidomastoid muscle. The incision was carried into the subcutaneous tissues and through the platysma. The sternocleidomastoid muscle was identified and reflected laterally.  The common carotid artery was then found at the base of the incision this was dissected free circumferentially. It was fairly soft on palpation.  The vagus nerve was identified and protected. Dissection was then carried up to the level carotid bifurcation. The common facial vein was ligated and divided between silk ties.  This was a high bifurcation and required extension mobilization of the hypoglossal nerve in order to get above the stenosis.  The posterior belly of the digastric was divided.  The ansa cervicalis was divided at its insertion to the hypoglossal in order to provide exposure.   Several tethering venous and arterial branches were ligated and divided between silk ties.  The hyperglossal nerve was extensively mobilized in order to provide exposure. The internal carotid artery was dissected free circumferentially just below the level of the hypoglossal nerve and it was soft in character at this location and above any palpable disease. A vessel loop  was placed around this. The vessel was fairly small. Next the external carotid and superior thyroid arteries were dissected free circumferentially and vessel loops were placed around these. The patient was given 9000 units of intravenous heparin.  After 2 minutes of circulation time and raising the mean arterial pressure to 90 mm mercury, the distal internal carotid artery was controlled with a baby Gregory clamp. The external carotid and superior thyroid arteries were controlled with vessel loops. The common carotid artery was controlled with a peripheral DeBakey clamp. A longitudinal opening was made in the common carotid artery just below the bifurcation. The arteriotomy was extended distally up into the internal carotid with Potts scissors. There was a large calcified plaque with greater than 80% stenosis in the internal carotid with some ulceration.  A 10 Fr shunt was brought onto the field and fashioned to fit the patient's artery.  This was threaded into the distal internal carotid artery and allowed to backbleed thoroughly.  There was good but not pulsatile backbleeding.  This was then threaded into the common carotid and secured with a Rummel tourniquet.   There was no air at this point and flow was restored to the brain.  Attention was then turned to the common carotid artery once again. A suitable endarterectomy plane was obtained and endarterectomy was begun in the common carotid artery and a good  proximal endpoint was obtained. An eversion endarterectomy was performed on the external carotid artery and a good endpoint was obtained. The plaque was then elevated in the internal carotid artery there was still some step off of the intima so this was tacked with several 7 0 prolene sutures.  The plaque was passed off the table. All loose debris was then removed from the carotid bed and everything was thoroughly irrigated with heparinized saline. A Dacron patch was then brought on to the operative field and  this was sewn on as a patch angioplasty using a running 6-0 Prolene suture. Prior to completion of the anastomosis the internal carotid artery was thoroughly backbled. This was then controlled again with a fine bulldog clamp.  The common carotid was thoroughly flushed forward. The external carotid was also thoroughly backbled.  The remainder of the patch was completed and the anastomosis was secured. Flow was then restored first retrograde from the external carotid into the carotid bed then antegrade from the common carotid to the external carotid artery and after approximately 5 cardiac cycles to the internal carotid artery. Doppler was used to evaluate the external/internal and common carotid arteries and these all had good Doppler flow. Hemostasis was obtained. The patient was also given 100 mg of Protamine. He had been given an additional 5000 units of heparin during the case.     The platysma muscle was reapproximated using a running 3-0 Vicryl suture. The skin was closed with 4 0 Vicryl subcuticular stitch.  The patient was awakened in the operating room and was moving upper and lower extremities symmetrically and following commands.  The patient was stable on arrival to the PACU.  Fabienne Bruns, MD [facility] [place] Office: [phone] Pager: [phone]

## 2014-09-15 NOTE — Anesthesia Postprocedure Evaluation (Signed)
  Anesthesia Post-op Note  Patient: Eric Hahn  Procedure(s) Performed: Procedure(s): RIGHT CAROTID ENDARTERECTOMY WITH HEMASHIELD PATCH ANGIOPLASTY  (Right)  Patient Location: PACU  Anesthesia Type:General  Level of Consciousness: awake and alert   Airway and Oxygen Therapy: Patient Spontanous Breathing  Post-op Pain: mild  Post-op Assessment: Post-op Vital signs reviewed LLE Motor Response: Purposeful movement, Responds to commands   RLE Motor Response: Purposeful movement, Responds to commands        Post-op Vital Signs: Reviewed  Last Vitals:  Filed Vitals:   09/15/14 1515  BP: 126/63  Pulse: 104  Temp: 36.3 C  Resp: 22    Complications: No apparent anesthesia complications

## 2014-09-16 ENCOUNTER — Encounter (HOSPITAL_COMMUNITY): Payer: Self-pay | Admitting: Vascular Surgery

## 2014-09-16 LAB — CBC
HCT: 36.6 % — ABNORMAL LOW (ref 39.0–52.0)
Hemoglobin: 12.2 g/dL — ABNORMAL LOW (ref 13.0–17.0)
MCH: 30.2 pg (ref 26.0–34.0)
MCHC: 33.3 g/dL (ref 30.0–36.0)
MCV: 90.6 fL (ref 78.0–100.0)
PLATELETS: 193 10*3/uL (ref 150–400)
RBC: 4.04 MIL/uL — ABNORMAL LOW (ref 4.22–5.81)
RDW: 13.4 % (ref 11.5–15.5)
WBC: 8 10*3/uL (ref 4.0–10.5)

## 2014-09-16 LAB — BASIC METABOLIC PANEL
ANION GAP: 8 (ref 5–15)
BUN: 16 mg/dL (ref 6–20)
CO2: 23 mmol/L (ref 22–32)
CREATININE: 0.94 mg/dL (ref 0.61–1.24)
Calcium: 8.8 mg/dL — ABNORMAL LOW (ref 8.9–10.3)
Chloride: 108 mmol/L (ref 101–111)
GFR calc Af Amer: >60 mL/min (ref >60)
GLUCOSE: 128 mg/dL — AB (ref 65–99)
POTASSIUM: 4 mmol/L (ref 3.5–5.1)
SODIUM: 139 mmol/L (ref 135–145)

## 2014-09-16 NOTE — Care Management Note (Signed)
Case Management Note  Patient Details  Name: Eric Hahn MRN: [id number] Date of Birth: 17-Apr-1940  Subjective/Objective:                 Admitted  s/p right CEA    Action/Plan: Return to home when medically stable. CM to f/u with d/c disposition.  Expected Discharge Date:  09/16/14               Expected Discharge Plan:  Home/Self Care  In-House Referral:     Discharge planning Services  CM Consult  Post Acute Care Choice:    Choice offered to:     DME Arranged:    DME Agency:     HH Arranged:    HH Agency:     Status of Service:  In process, will continue to follow  Medicare Important Message Given:    Date Medicare IM Given:    Medicare IM give by:    Date Additional Medicare IM Given:    Additional Medicare Important Message give by:     If discussed at Long Length of Stay Meetings, dates discussed:    Additional Comments: Fairley Copher (Spouse)  [phone]  Gae Gallop [place], [place] [phone] 09/16/2014, 11:01 AM

## 2014-09-16 NOTE — Progress Notes (Signed)
Discharge instructions reviewed with patient and wife who is at bedside. All questions answered. Prescription handed to patient. Appointment details reviewed. Patient and wife both verbalize understanding of instructions. VSS. Pt is in no acute distress. Patient discharged via wheelchair.

## 2014-09-16 NOTE — Progress Notes (Addendum)
  [facility] Progress Note  09/16/2014 7:30 AM 1 Day Post-Op  Subjective:  No complaints.   Tmax 98.7 BP sys 70s-130s 02 95% RA  Filed Vitals:   09/16/14 0600  BP: 98/54  Pulse: 87  Temp:   Resp: 19     Physical Exam: Neuro:  5/5 strength upper and lower extremities. Tongue midline. No facial droop.  Incision:  Right neck incision clean and intact. No hematoma.   CBC    Component Value Date/Time   WBC 8.0 09/16/2014 0302   WBC 5.1 07/22/2014 0835   RBC 4.04* 09/16/2014 0302   RBC 4.75 07/22/2014 0835   HGB 12.2* 09/16/2014 0302   HGB 14.3 07/22/2014 0835   HCT 36.6* 09/16/2014 0302   HCT 42.4* 07/22/2014 0835   PLT 193 09/16/2014 0302   MCV 90.6 09/16/2014 0302   MCV 89.3 07/22/2014 0835   MCH 30.2 09/16/2014 0302   MCH 30.1 07/22/2014 0835   MCHC 33.3 09/16/2014 0302   MCHC 33.7 07/22/2014 0835   RDW 13.4 09/16/2014 0302   LYMPHSABS 2.0 07/10/2013 1048   MONOABS 0.4 07/10/2013 1048   EOSABS 0.3 07/10/2013 1048   BASOSABS 0.0 07/10/2013 1048    BMET    Component Value Date/Time   NA 139 09/16/2014 0302   K 4.0 09/16/2014 0302   CL 108 09/16/2014 0302   CO2 23 09/16/2014 0302   GLUCOSE 128* 09/16/2014 0302   BUN 16 09/16/2014 0302   CREATININE 0.94 09/16/2014 0302   CREATININE 0.86 07/22/2014 0830   CALCIUM 8.8* 09/16/2014 0302   GFRNONAA >60 09/16/2014 0302   GFRNONAA 87 07/22/2014 0830   GFRAA >60 09/16/2014 0302   GFRAA >89 07/22/2014 0830     Intake/Output Summary (Last 24 hours) at 09/16/14 0730 Last data filed at 09/16/14 0600  Gross per 24 hour  Intake 4935.51 ml  Output   3150 ml  Net 1785.51 ml      Assessment/Plan:  This is a 74 y.o. male who is s/p right CEA 1 Day Post-Op  Still requiring dopamine support for post-op hypotension. Wean off dopamine.  Neuro exam intact. Incision without hematoma. Has ambulated and voided. Rankin score: 0 VQI: on ASA, intolerant of statins.  D/C home once BP is stable off  dopamine.   Maris Berger, PA-C [facility] Office: [phone] Pager: [phone] 09/16/2014 7:30 AM    Agree with above.  Neuro intact no hematoma Home today if we can wean off dopamine  Fabienne Bruns, MD [facility] [place] Office: [phone] Pager: [phone]

## 2014-09-18 NOTE — Discharge Summary (Signed)
[facility] Discharge Summary  Eric Hahn [birth year] 74 y.o. male  [id number]  Admission Date: 09/15/2014  Discharge Date: 09/16/2014  Physician: Fabienne Bruns, MD  Admission Diagnosis: Right carotid artery stenosis I65.21  HPI:   This is a 74 y.o. male who presented for evaluation of asymptomatic carotid stenosis. Patient had a carotid duplex scan recently which showed greater than 70% right internal carotid artery stenosis. The carotid duplex was performed in follow-up after workup for a nodule in the left neck and calcification of the carotids was seen on CT scan. The patient denies any prior symptoms of TIA amaurosis or stroke. He is currently on aspirin. He is a former smoker but quit 40 years ago. He also has a history of elevated cholesterol but has been intolerant of cholesterol lowering medications in the past. He states that they increased his liver enzymes or gave him anxiety. Other medical problems include hypertension. He did have a cardiac stress test 3-4 years ago which she said was normal. He is able to climb several flights of stairs without becoming short of breath. He denies chest pain. 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  Hospital Course:  The patient was admitted to the hospital and taken to the operating room on 09/15/2014 and underwent right carotid endarterectomy with hemashield patch angioplasty.  The patient tolerated the procedure well and was transported to the PACU in stable condition.  By POD 1, the patient's neuro status was intact. His incision was without hematoma. He required dopamine post-operatively for hypotension. This was able to weaned off on POD 1. He was ambulating, voiding and tolerating a diet without difficulty. He was discharged home on POD 1 in good condition.     Recent Labs  09/16/14 0302  NA 139  K 4.0  CL 108  CO2 23  GLUCOSE 128*  BUN 16  CALCIUM 8.8*    Recent Labs  09/15/14 1638  09/16/14 0302  WBC 8.1 8.0  HGB 12.6* 12.2*  HCT 37.2* 36.6*  PLT 181 193   No results for input(s): INR in the last 72 hours.  Discharge Instructions:   The patient is discharged to home with extensive instructions on wound care and progressive ambulation.  They are instructed not to drive or perform any heavy lifting until returning to see the physician in his office.  Discharge Instructions    Call MD for:  redness, tenderness, or signs of infection (pain, swelling, bleeding, redness, odor or green/yellow discharge around incision site)    Complete by:  As directed      Call MD for:  severe or increased pain, loss or decreased feeling  in affected limb(s)    Complete by:  As directed      Call MD for:  temperature >100.5    Complete by:  As directed      Discharge wound care:    Complete by:  As directed   Wash wound daily with soap and water and pat dry.     Driving Restrictions    Complete by:  As directed   No driving for 2 weeks     Increase activity slowly    Complete by:  As directed   Walk with assistance use walker or cane as needed     Lifting restrictions    Complete by:  As directed   No lifting for 2 weeks     Resume previous diet    Complete by:  As  directed            Discharge Diagnosis:  Right carotid artery stenosis I65.21  Secondary Diagnosis: Patient Active Problem List   Diagnosis Date Noted  . Asymptomatic stenosis of right carotid artery 09/15/2014  . Other and unspecified hyperlipidemia 06/10/2012  . Abnormal ECG 06/06/2010  . Hypertension 06/06/2010   Past Medical History  Diagnosis Date  . Hyperlipidemia   . Obesity   . Hypertension   . Carotid artery occlusion   . Arthritis   . PONV (postoperative nausea and vomiting)     pt. states that was sick with first surgery but 2nd surgery used patch behind ears and had no issues  . HOH (hard of hearing)   . History of asthma     pt. states as a child  . History of pneumonia   . History  of bronchitis   . History of kidney stones   . Full dentures       Medication List    STOP taking these medications        lovastatin 20 MG tablet  Commonly known as:  MEVACOR      TAKE these medications        aspirin 81 MG tablet  Take 81 mg by mouth daily.     Flax Seed Oil 1000 MG Caps  Take 1 capsule by mouth daily. daily     fosinopril 40 MG tablet  Commonly known as:  MONOPRIL  Take 1 tablet (40 mg total) by mouth daily.     KRILL OIL PO  Take 1 tablet by mouth daily.     oxyCODONE 5 MG immediate release tablet  Commonly known as:  ROXICODONE  Take 1 tablet (5 mg total) by mouth every 6 (six) hours as needed for severe pain.     Red Yeast Rice 600 MG Tabs  Take 600 mg by mouth daily. Daily        Oxycodone #20 No Refill  Disposition: Home  Patient's condition: is Good  Follow up: 1. Dr.  Darrick Penna in 2 weeks.   Maris Berger, PA-C [facility] [phone]  --- For [facility] [place] use --- Instructions: Press F2 to tab through selections.  Delete question if not applicable.   Modified Rankin score at D/C (0-6): 0  IV medication needed for:  1. Hypertension: No 2. Hypotension: Yes  Post-op Complications: No  1. Post-op CVA or TIA: No  2. CN injury: No  3. Myocardial infarction: No  4.  CHF: No  5.  Dysrhythmia (new): No  6. Wound infection: No  7. Reperfusion symptoms: No  8. Return to OR: No   Discharge medications: Statin use:  No If No: [x ] For Medical reasons (intolerant), [ ]  Non-compliant, [ ]  Not-indicated ASA use:  Yes  If No: [ ]  For Medical reasons, [ ]  Non-compliant, [ ]  Not-indicated Beta blocker use:  No If No: [ ]  For Medical reasons, [ ]  Non-compliant, [x ] Not-indicated ACE-Inhibitor use:  Yes If No: [ ]  For Medical reasons, [ ]  Non-compliant, [ ]  Not-indicated P2Y12 Antagonist use: No, [ ]  Plavix, [ ]  Plasugrel, [ ]  Ticlopinine, [ ]  Ticagrelor, [ ]  Other, [ ]  No for medical reason, [ ]   Non-compliant, [x ] Not-indicated Anti-coagulant use:  No, [ ]  Warfarin, [ ]  Rivaroxaban, [ ]  Dabigatran, [ ]  Other, [ ]  No for medical reason, [ ]  Non-compliant, [x ] Not-indicated

## 2014-09-30 ENCOUNTER — Encounter: Payer: Self-pay | Admitting: Vascular Surgery

## 2014-10-01 ENCOUNTER — Encounter: Payer: Self-pay | Admitting: Vascular Surgery

## 2014-10-01 ENCOUNTER — Ambulatory Visit (INDEPENDENT_AMBULATORY_CARE_PROVIDER_SITE_OTHER): Payer: Medicare Other | Admitting: Vascular Surgery

## 2014-10-01 VITALS — BP 105/79 | HR 70 | Temp 98.5°F | Ht 72.0 in | Wt 203.0 lb

## 2014-10-01 DIAGNOSIS — I6521 Occlusion and stenosis of right carotid artery: Secondary | ICD-10-CM

## 2014-10-01 DIAGNOSIS — Z9889 Other specified postprocedural states: Secondary | ICD-10-CM

## 2014-10-01 NOTE — Progress Notes (Signed)
[facility]  HISTORY AND PHYSICAL    History of Present Illness:  Patient is a 74 y.o. year old male who presents for post-operative follow-up after right carotid endarterectomy.  Denies headaches, numbness, tingling or other neuro deficits.  No swallowing problems.  No incisional drainage.  He had less than 50% stenosis on the contralateral side. He continues to take aspirin daily.   Physical Examination  Filed Vitals:   10/01/14 1033  BP: 105/79  Pulse:   Temp:     Body mass index is 27.53 kg/(m^2).  General:  Alert and oriented, no acute distress Neck: No bruit or JVD, healing right neck incision Skin: No rash Neurologic: Upper and lower extremity motor 5/5 and symmetric  ASSESSMENT: Doing well status post right carotid endarterectomy   PLAN: Follow-up carotid duplex scan 6 months. Continue aspirin daily.   Fabienne Bruns, MD [facility] [place] Office: [phone] Pager: [phone]

## 2014-10-02 NOTE — Addendum Note (Signed)
Addended by: Adria Dill L on: 10/02/2014 11:28 AM   Modules accepted: Orders

## 2014-10-08 ENCOUNTER — Encounter: Payer: Self-pay | Admitting: Emergency Medicine

## 2014-10-16 ENCOUNTER — Telehealth: Payer: Self-pay | Admitting: Emergency Medicine

## 2014-10-16 NOTE — Telephone Encounter (Signed)
Spoke with Pt but he was unable to hear me from his location. So he agreed to call back regarding the message Dr. Cleta Alberts sent.

## 2014-10-16 NOTE — Telephone Encounter (Signed)
Spoke with Pt, and relayed Dr. Ellis Parents message. He understood and stated he would be able to come in on Wednesday to see Dr. Cleta Alberts

## 2014-10-16 NOTE — Telephone Encounter (Signed)
Patient sent the following message to Dr. Cleta Alberts via MyChart:    Following up on recent medical issues, I have completed treatment of the blockage in my carotid artery. During the ultrasound of the artery, a 13 mm nodule was discovered on the right side of my thyroid. If you recall, you had discovered the lump during a recent office visit, and requested follow up which uncovered the blockage. During a telephone conversation in late June, you suggested resolving the blockage issue, then follow up on the thyroid nodule.  Please advise what the next step should be. Do I need to come in for a consultation or re-check? I want to thank you for your diligence and starting the process which helped to prevent a life threatening event. If you received this information twice, I apologize. A computer glitch erased the screen, and I was not sure if the message was sent.  [employer]  [birth year]

## 2014-10-16 NOTE — Telephone Encounter (Signed)
Please call patient. I would like to see him next week. I work Wednesday and Friday. 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 This may only need to be followed closely. I am so glad he was able to have his surgery and recover. Please call him so we can follow-up on this submaxillary gland enlargement next week.

## 2014-10-21 ENCOUNTER — Ambulatory Visit (INDEPENDENT_AMBULATORY_CARE_PROVIDER_SITE_OTHER): Payer: Medicare Other | Admitting: Emergency Medicine

## 2014-10-21 VITALS — BP 112/74 | HR 72 | Temp 98.4°F | Resp 16 | Ht 72.0 in | Wt 204.4 lb

## 2014-10-21 DIAGNOSIS — R938 Abnormal findings on diagnostic imaging of other specified body structures: Secondary | ICD-10-CM

## 2014-10-21 DIAGNOSIS — R9389 Abnormal findings on diagnostic imaging of other specified body structures: Secondary | ICD-10-CM

## 2014-10-21 DIAGNOSIS — E041 Nontoxic single thyroid nodule: Secondary | ICD-10-CM

## 2014-10-21 NOTE — Progress Notes (Addendum)
Patient ID: Eric Hahn, male   DOB: Sep 08, 1940, 74 y.o.   MRN: [id number]    This chart was scribed for [name], MD by Charline Bills, ED Scribe. The patient was seen in room 1. Patient's care was started at 8:16 AM.   Chief Complaint:  Chief Complaint  Patient presents with  . Mass    Back in june, they found a lump in the back of his throat and he is following up about it   HPI: Eric Hahn is a 74 y.o. male, with a h/o HTN and carotid artery occlusion, who reports to [facility] today for a follow-up regarding a mass in the back of his throat first noticed 3 months ago. Pt denies associated pain, difficulty swallowing, choking. Pt had a normal CT neck on 6/14 however, his US carotid duplex on 6/24 showed a 13 mm solid right thyroid nodule. Pt expresses his concerns of thyroid cancer. He recently had a right carotid endarterectomy done at [facility] on 09/15/14 by Dr. Fabienne Bruns. Pt denies any complications following surgery and has an upcoming appointment. Pt reports a 3 year h/o tobacco use from age [age].  Past Medical History  Diagnosis Date  . Hyperlipidemia   . Obesity   . Hypertension   . Carotid artery occlusion   . Arthritis   . PONV (postoperative nausea and vomiting)     pt. states that was sick with first surgery but 2nd surgery used patch behind ears and had no issues  . HOH (hard of hearing)   . History of asthma     pt. states as a child  . History of pneumonia   . History of bronchitis   . History of kidney stones   . Full dentures    Past Surgical History  Procedure Laterality Date  . Spine surgery      C-7 disk surgery  by Dr. Darreld Mclean   . Nasal sinus surgery    . Endarterectomy Right 09/15/2014    Procedure: RIGHT CAROTID ENDARTERECTOMY WITH HEMASHIELD PATCH ANGIOPLASTY ;  Surgeon: Sherren Kerns, MD;  Location: [facility] OR;  Service: Vascular;  Laterality: Right;   Social History   Social History  . Marital Status: Married    Spouse Name: N/A  .  Number of Children: N/A  . Years of Education: N/A   Social History Main Topics  . Smoking status: Former Smoker    Types: Cigarettes    Quit date: 06/06/1975  . Smokeless tobacco: None  . Alcohol Use: No  . Drug Use: No  . Sexual Activity: Not Asked   Other Topics Concern  . None   Social History Narrative   Family History  Problem Relation Age of Onset  . Hyperlipidemia Father   . Arthritis Sister    Allergies  Allergen Reactions  . Sulfa Antibiotics Other (See Comments)    Unknown  . Amoxicillin Hives  . Statins Other (See Comments)    Pt is intolerant-muscle aches and weakness   Prior to Admission medications   Medication Sig Start Date End Date Taking? Authorizing Provider  aspirin 81 MG tablet Take 81 mg by mouth daily.     Yes Historical Provider, MD  Flaxseed, Linseed, (FLAX SEED OIL) 1000 MG CAPS Take 1 capsule by mouth daily. daily   Yes Historical Provider, MD  fosinopril (MONOPRIL) 40 MG tablet Take 1 tablet (40 mg total) by mouth daily. 07/22/14  Yes [name], MD  KRILL OIL  PO Take 1 tablet by mouth daily.    Yes Historical Provider, MD  Red Yeast Rice 600 MG TABS Take 600 mg by mouth daily. Daily   Yes Historical Provider, MD  oxyCODONE (ROXICODONE) 5 MG immediate release tablet Take 1 tablet (5 mg total) by mouth every 6 (six) hours as needed for severe pain. Patient not taking: Reported on 10/01/2014 09/15/14   Raymond Gurney, PA-C     ROS: The patient denies fevers, chills, night sweats, unintentional weight loss, sore throat, difficulty swallowing, choking, chest pain, palpitations, wheezing, dyspnea on exertion, nausea, vomiting, abdominal pain, dysuria, hematuria, melena, numbness, weakness, or tingling.   All other systems have been reviewed and were otherwise negative with the exception of those mentioned in the HPI and as above.    PHYSICAL EXAM: Filed Vitals:   10/21/14 0811  BP: 112/74  Pulse: 72  Temp: 98.4 F (36.9 C)  Resp: 16    Body mass index is 27.72 kg/(m^2).   General: Alert, no acute distress HEENT:  Normocephalic, atraumatic, oropharynx patent. Eye: Nonie Hoyer [facility] Cardiovascular:  Regular rate and rhythm, no rubs murmurs or gallops.  No Carotid bruits, radial pulse intact. No pedal edema.  Respiratory: Clear to auscultation bilaterally.  No wheezes, rales, or rhonchi.  No cyanosis, no use of accessory musculature Abdominal: No organomegaly, abdomen is soft and non-tender, positive bowel sounds.  No masses. Musculoskeletal: Gait intact. No edema, tenderness Skin: No rashes. Neurologic: Facial musculature symmetric. Psychiatric: Patient acts appropriately throughout our interaction. Lymphatic: No cervical or submandibular lymphadenopathy Genitourinary/Anorectal: No acute findings  LABS: Results for orders placed or performed during the hospital encounter of 09/15/14  CBC  Result Value Ref Range   WBC 8.1 4.0 - 10.5 K/uL   RBC 4.14 (L) 4.22 - 5.81 MIL/uL   Hemoglobin 12.6 (L) 13.0 - 17.0 g/dL   HCT 16.1 (L) 09.6 - 04.5 %   MCV 89.9 78.0 - 100.0 fL   MCH 30.4 26.0 - 34.0 pg   MCHC 33.9 30.0 - 36.0 g/dL   RDW 40.9 81.1 - 91.4 %   Platelets 181 150 - 400 K/uL  Creatinine, serum  Result Value Ref Range   Creatinine, Ser 0.92 0.61 - 1.24 mg/dL   GFR calc non Af Amer >60 >60 mL/min   GFR calc Af Amer >60 >60 mL/min  CBC  Result Value Ref Range   WBC 8.0 4.0 - 10.5 K/uL   RBC 4.04 (L) 4.22 - 5.81 MIL/uL   Hemoglobin 12.2 (L) 13.0 - 17.0 g/dL   HCT 78.2 (L) 95.6 - 21.3 %   MCV 90.6 78.0 - 100.0 fL   MCH 30.2 26.0 - 34.0 pg   MCHC 33.3 30.0 - 36.0 g/dL   RDW 08.6 57.8 - 46.9 %   Platelets 193 150 - 400 K/uL  Basic metabolic panel  Result Value Ref Range   Sodium 139 135 - 145 mmol/L   Potassium 4.0 3.5 - 5.1 mmol/L   Chloride 108 101 - 111 mmol/L   CO2 23 22 - 32 mmol/L   Glucose, Bld 128 (H) 65 - 99 mg/dL   BUN 16 6 - 20 mg/dL   Creatinine, Ser 6.29 0.61 - 1.24 mg/dL   Calcium 8.8 (L) 8.9  - 10.3 mg/dL   GFR calc non Af Amer >60 >60 mL/min   GFR calc Af Amer >60 >60 mL/min   Anion gap 8 5 - 15     EKG/XRAY:   Primary read interpreted by  Dr. Cleta Alberts at [facility].   ASSESSMENT/PLAN: Patient had a CT of the neck and on CT the thyroid appeared normal however on carotid Doppler study there appear to be a 13 mm right thyroid nodule. We will proceed with ENT evaluation of the right submaxillary prominence. He will have an ultrasound of his thyroid in about 3 months. I will see him in 3 months in order it at that time.I personally performed the services described in this documentation, which was scribed in my presence. The recorded information has been reviewed and is accurate.    Gross sideeffects, risk and benefits, and alternatives of medications d/w patient. Patient is aware that all medications have potential sideeffects and we are unable to predict every sideeffect or drug-drug interaction that may occur.  Lesle Chris MD 10/21/2014 8:15 AM

## 2014-11-10 ENCOUNTER — Other Ambulatory Visit: Payer: Self-pay | Admitting: Otolaryngology

## 2014-11-10 DIAGNOSIS — E041 Nontoxic single thyroid nodule: Secondary | ICD-10-CM

## 2014-11-12 ENCOUNTER — Other Ambulatory Visit: Payer: Medicare Other

## 2014-11-17 ENCOUNTER — Encounter: Payer: Self-pay | Admitting: Emergency Medicine

## 2015-01-25 ENCOUNTER — Ambulatory Visit
Admission: RE | Admit: 2015-01-25 | Discharge: 2015-01-25 | Disposition: A | Discharge status: Home / Self Care | Payer: Medicare Other | Source: Ambulatory Visit | Attending: Otolaryngology | Admitting: Otolaryngology

## 2015-01-25 DIAGNOSIS — E041 Nontoxic single thyroid nodule: Secondary | ICD-10-CM

## 2015-03-25 ENCOUNTER — Encounter: Payer: Self-pay | Admitting: Vascular Surgery

## 2015-04-01 ENCOUNTER — Ambulatory Visit (HOSPITAL_COMMUNITY)
Admission: RE | Admit: 2015-04-01 | Discharge: 2015-04-01 | Disposition: A | Discharge status: Home / Self Care | Payer: Medicare Other | Source: Ambulatory Visit | Attending: Vascular Surgery | Admitting: Vascular Surgery

## 2015-04-01 ENCOUNTER — Ambulatory Visit (INDEPENDENT_AMBULATORY_CARE_PROVIDER_SITE_OTHER): Payer: Medicare Other | Admitting: Vascular Surgery

## 2015-04-01 ENCOUNTER — Encounter: Payer: Self-pay | Admitting: Vascular Surgery

## 2015-04-01 VITALS — BP 122/82 | HR 60 | Temp 98.1°F | Resp 14 | Ht 72.0 in | Wt 210.0 lb

## 2015-04-01 DIAGNOSIS — I6521 Occlusion and stenosis of right carotid artery: Secondary | ICD-10-CM

## 2015-04-01 DIAGNOSIS — I6522 Occlusion and stenosis of left carotid artery: Secondary | ICD-10-CM | POA: Insufficient documentation

## 2015-04-01 DIAGNOSIS — Z9889 Other specified postprocedural states: Secondary | ICD-10-CM | POA: Insufficient documentation

## 2015-04-01 DIAGNOSIS — I10 Essential (primary) hypertension: Secondary | ICD-10-CM | POA: Insufficient documentation

## 2015-04-01 DIAGNOSIS — I6523 Occlusion and stenosis of bilateral carotid arteries: Secondary | ICD-10-CM

## 2015-04-01 DIAGNOSIS — E785 Hyperlipidemia, unspecified: Secondary | ICD-10-CM | POA: Diagnosis not present

## 2015-04-01 NOTE — Progress Notes (Signed)
Vascular and Vein Specialist of [facility]  Patient name: Eric Hahn MRN: [id number] DOB: 1940/03/18 Sex: male  REASON FOR VISIT: Follow-up carotid disease  HPI: Eric Hahn is a 75 y.o. male who returns for follow-up of carotid disease. Patient previously had a right carotid endarterectomy August 2016. He denies any symptoms of TIA amaurosis or stroke. He currently takes aspirin daily. He is unable to take statin due to previous muscle issues. Chronic medical problems include hyperlipidemia hypertension and asthma all of which are stable. He has not smoked since 1977.  Past Medical History  Diagnosis Date  . Hyperlipidemia   . Obesity   . Hypertension   . Carotid artery occlusion   . Arthritis   . PONV (postoperative nausea and vomiting)     pt. states that was sick with first surgery but 2nd surgery used patch behind ears and had no issues  . HOH (hard of hearing)   . History of asthma     pt. states as a child  . History of pneumonia   . History of bronchitis   . History of kidney stones   . Full dentures     Family History  Problem Relation Age of Onset  . Hyperlipidemia Father   . Arthritis Sister     SOCIAL HISTORY: Social History  Substance Use Topics  . Smoking status: Former Smoker    Types: Cigarettes    Quit date: 06/06/1975  . Smokeless tobacco: Never Used  . Alcohol Use: No    Allergies  Allergen Reactions  . Sulfa Antibiotics Other (See Comments)    Unknown  . Amoxicillin Hives  . Statins Other (See Comments)    Pt is intolerant-muscle aches and weakness    Current Outpatient Prescriptions  Medication Sig Dispense Refill  . aspirin 81 MG tablet Take 81 mg by mouth daily.      . Flaxseed, Linseed, (FLAX SEED OIL) 1000 MG CAPS Take 1 capsule by mouth daily. daily    . fosinopril (MONOPRIL) 40 MG tablet Take 1 tablet (40 mg total) by mouth daily. 90 tablet 3  . KRILL OIL PO Take 1 tablet by mouth daily.     . Red Yeast Rice 600 MG TABS Take  600 mg by mouth daily. Daily    . oxyCODONE (ROXICODONE) 5 MG immediate release tablet Take 1 tablet (5 mg total) by mouth every 6 (six) hours as needed for severe pain. (Patient not taking: Reported on 10/01/2014) 20 tablet 0   No current facility-administered medications for this visit.    REVIEW OF SYSTEMS:   denotes positive finding,  denotes negative finding Cardiac  Comments:  Chest pain or chest pressure:    Shortness of breath upon exertion:    Short of breath when lying flat:    Irregular heart rhythm:        Vascular    Pain in calf, thigh, or hip brought on by ambulation:    Pain in feet at night that wakes you up from your sleep:     Blood clot in your veins:    Leg swelling:         Pulmonary    Oxygen at home:    Productive cough:     Wheezing:         Neurologic    Sudden weakness in arms or legs:     Sudden numbness in arms or legs:     Sudden onset of difficulty speaking or slurred  speech:    Temporary loss of vision in one eye:     Problems with dizziness:         Gastrointestinal    Blood in stool:     Vomited blood:         Genitourinary    Burning when urinating:     Blood in urine:        Psychiatric    Major depression:         Hematologic    Bleeding problems:    Problems with blood clotting too easily:        Skin    Rashes or ulcers:        Constitutional    Fever or chills:      PHYSICAL EXAM: Filed Vitals:   04/01/15 1000 04/01/15 1003  BP: 125/73 122/82  Pulse: 58 60  Temp:  98.1 F (36.7 C)  TempSrc:  Oral  Resp:  14  Height:  6' (1.829 m)  Weight:  210 lb (95.255 kg)  SpO2:  97%    GENERAL: The patient is a well-nourished male, in no acute distress. The vital signs are documented above. CARDIAC: There is a regular rate and rhythm.  VASCULAR: No carotid bruits well-healed right neck scar PULMONARY: There is good air exchange bilaterally without wheezing or rales. ABDOMEN: Soft and non-tender with normal pitched  bowel sounds.  MUSCULOSKELETAL: There are no major deformities or cyanosis. NEUROLOGIC: No focal weakness or paresthesias are detected. No facial asymmetry, symmetric upper extremity and lower extremity motor strength which is 5 over 5 SKIN: There are no ulcers or rashes noted. PSYCHIATRIC: The patient has a normal affect.  DATA:  Patient is a carotid duplex scan today. I reviewed and interpreted this study. No significant right internal carotid artery stenosis. Less than 40% left internal carotid artery stenosis. Antegrade vertebral flow.  MEDICAL ISSUES: Patient status post right carotid endarterectomy. He has no significant carotid occlusive disease on the left side. He has no restenosis on the right side. He will follow-up with [place] with a repeat carotid duplex scan in 1 year. He'll continue to take his aspirin.  Fabienne Bruns [facility] [employer] [facility] [employer]: [phone]

## 2015-04-02 NOTE — Addendum Note (Signed)
Addended by: Adria Dill L on: 04/02/2015 10:36 AM   Modules accepted: Orders

## 2015-07-15 ENCOUNTER — Encounter: Payer: Self-pay | Admitting: Emergency Medicine

## 2015-07-15 ENCOUNTER — Ambulatory Visit (INDEPENDENT_AMBULATORY_CARE_PROVIDER_SITE_OTHER): Payer: Medicare Other | Admitting: Emergency Medicine

## 2015-07-15 VITALS — BP 140/90 | HR 62 | Temp 98.0°F | Resp 16 | Ht 71.5 in | Wt 208.4 lb

## 2015-07-15 DIAGNOSIS — I10 Essential (primary) hypertension: Secondary | ICD-10-CM | POA: Diagnosis not present

## 2015-07-15 DIAGNOSIS — Z0000 Encounter for general adult medical examination without abnormal findings: Secondary | ICD-10-CM | POA: Diagnosis not present

## 2015-07-15 DIAGNOSIS — Z125 Encounter for screening for malignant neoplasm of prostate: Secondary | ICD-10-CM

## 2015-07-15 DIAGNOSIS — E041 Nontoxic single thyroid nodule: Secondary | ICD-10-CM | POA: Diagnosis not present

## 2015-07-15 DIAGNOSIS — E7800 Pure hypercholesterolemia, unspecified: Secondary | ICD-10-CM

## 2015-07-15 LAB — CBC WITH DIFFERENTIAL/PLATELET
BASOS PCT: 1 %
Basophils Absolute: 52 cells/uL (ref 0–200)
EOS ABS: 416 {cells}/uL (ref 15–500)
Eosinophils Relative: 8 %
HEMATOCRIT: 43.5 % (ref 38.5–50.0)
HEMOGLOBIN: 14.8 g/dL (ref 13.2–17.1)
Lymphocytes Relative: 28 %
Lymphs Abs: 1456 cells/uL (ref 850–3900)
MCH: 29.7 pg (ref 27.0–33.0)
MCHC: 34 g/dL (ref 32.0–36.0)
MCV: 87.3 fL (ref 80.0–100.0)
MONO ABS: 312 {cells}/uL (ref 200–950)
MPV: 10.7 fL (ref 7.5–12.5)
Monocytes Relative: 6 %
NEUTROS ABS: 2964 {cells}/uL (ref 1500–7800)
Neutrophils Relative %: 57 %
Platelets: 252 10*3/uL (ref 140–400)
RBC: 4.98 MIL/uL (ref 4.20–5.80)
RDW: 14.4 % (ref 11.0–15.0)
WBC: 5.2 10*3/uL (ref 3.8–10.8)

## 2015-07-15 LAB — COMPLETE METABOLIC PANEL WITH GFR
ALT: 17 U/L (ref 9–46)
AST: 18 U/L (ref 10–35)
Albumin: 4.6 g/dL (ref 3.6–5.1)
Alkaline Phosphatase: 65 U/L (ref 40–115)
BILIRUBIN TOTAL: 0.7 mg/dL (ref 0.2–1.2)
BUN: 15 mg/dL (ref 7–25)
CALCIUM: 10 mg/dL (ref 8.6–10.3)
CHLORIDE: 104 mmol/L (ref 98–110)
CO2: 21 mmol/L (ref 20–31)
CREATININE: 0.89 mg/dL (ref 0.70–1.18)
GFR, EST NON AFRICAN AMERICAN: 85 mL/min (ref >=60)
Glucose, Bld: 91 mg/dL (ref 65–99)
Potassium: 4.4 mmol/L (ref 3.5–5.3)
Sodium: 139 mmol/L (ref 135–146)
TOTAL PROTEIN: 7.5 g/dL (ref 6.1–8.1)

## 2015-07-15 LAB — POC MICROSCOPIC URINALYSIS (UMFC): Mucus: ABSENT

## 2015-07-15 LAB — POCT URINALYSIS DIP (MANUAL ENTRY)
BILIRUBIN UA: NEGATIVE
Bilirubin, UA: NEGATIVE
Glucose, UA: NEGATIVE
LEUKOCYTES UA: NEGATIVE
Nitrite, UA: NEGATIVE
PH UA: 7
PROTEIN UA: NEGATIVE
SPEC GRAV UA: 1.02
UROBILINOGEN UA: 0.2

## 2015-07-15 LAB — LIPID PANEL
CHOLESTEROL: 294 mg/dL — AB (ref 125–200)
HDL: 46 mg/dL (ref >=40)
LDL Cholesterol: 199 mg/dL — ABNORMAL HIGH (ref <130)
TRIGLYCERIDES: 243 mg/dL — AB (ref <150)
Total CHOL/HDL Ratio: 6.4 Ratio — ABNORMAL HIGH (ref <=5.0)
VLDL: 49 mg/dL — ABNORMAL HIGH (ref <30)

## 2015-07-15 MED ORDER — FOSINOPRIL SODIUM 40 MG PO TABS: 40.0000 mg | ORAL_TABLET | Freq: Every day | ORAL | Status: DC

## 2015-07-15 NOTE — Patient Instructions (Signed)
     IF you received an x-ray today, you will receive an invoice from [facility]. Please contact [facility] at [phone] with questions or concerns regarding your invoice.   IF you received labwork today, you will receive an invoice from [facility]. Please contact [facility] at [phone] with questions or concerns regarding your invoice.   Our billing staff will not be able to assist you with questions regarding bills from these companies.  You will be contacted with the lab results as soon as they are available. The fastest way to get your results is to activate your My Chart account. Instructions are located on the last page of this paperwork. If you have not heard from us regarding the results in 2 weeks, please contact this office.      

## 2015-07-15 NOTE — Progress Notes (Addendum)
Patient ID: KASHMERE STAFFA, male   DOB: [birth year], 75 y.o.   MRN: [id number]    By signing my name below I, Shelah Lewandowsky, attest that this documentation has been prepared under the direction and in the presence of Lesle Chris, MD. Electonically Signed. Shelah Lewandowsky, Scribe 07/15/2015 at 10:07 AM  Chief Complaint:  Chief Complaint  Patient presents with  . Annual Exam  . Medication Refill    Monopril 40 mg    HPI: LAMOYNE HESSEL is a 75 y.o. male who reports to [facility] today for his annual exam an monopril  prescription refill.   Pt states when he was given a prescription for oxycodone after a neck surgery. Pt states he never filled the medication and has never taken oxycodone before.   Pt denies taking any HLD medication due to getting muscle aches and weakness whenever he takes statins.   Pt has followed up this year with Dr Jenne Pane to have his thyroids evaluated and states that everything is normal.  Pt reports having a colonoscopy that was perfectly normal this year with [facility] physicians.   Pt states he is feeling great.   Past Medical History  Diagnosis Date  . Hyperlipidemia   . Obesity   . Hypertension   . Carotid artery occlusion   . Arthritis   . PONV (postoperative nausea and vomiting)     pt. states that was sick with first surgery but 2nd surgery used patch behind ears and had no issues  . HOH (hard of hearing)   . History of asthma     pt. states as a child  . History of pneumonia   . History of bronchitis   . History of kidney stones   . Full dentures    Past Surgical History  Procedure Laterality Date  . Spine surgery      C-7 disk surgery  by Dr. Darreld Mclean   . Nasal sinus surgery    . Endarterectomy Right 09/15/2014    Procedure: RIGHT CAROTID ENDARTERECTOMY WITH HEMASHIELD PATCH ANGIOPLASTY ;  Surgeon: Sherren Kerns, MD;  Location: [facility] OR;  Service: Vascular;  Laterality: Right;  . Carotid endarterectomy Right 09-15-14   Social History   Social  History  . Marital Status: Married    Spouse Name: N/A  . Number of Children: N/A  . Years of Education: N/A   Social History Main Topics  . Smoking status: Former Smoker    Types: Cigarettes    Quit date: 06/06/1975  . Smokeless tobacco: Never Used  . Alcohol Use: No  . Drug Use: No  . Sexual Activity: Not Asked   Other Topics Concern  . None   Social History Narrative   Married   Education: [employer]   Exercise: Yes   Family History  Problem Relation Age of Onset  . Hyperlipidemia Father   . Arthritis Sister    Allergies  Allergen Reactions  . Sulfa Antibiotics Other (See Comments)    Unknown  . Amoxicillin Hives  . Statins Other (See Comments)    Pt is intolerant-muscle aches and weakness   Prior to Admission medications   Medication Sig Start Date End Date Taking? Authorizing Provider  aspirin 81 MG tablet Take 81 mg by mouth daily.     Yes Historical Provider, MD  Flaxseed, Linseed, (FLAX SEED OIL) 1000 MG CAPS Take 1 capsule by mouth daily. daily   Yes Historical Provider, MD  fosinopril (MONOPRIL) 40 MG tablet Take 1 tablet (  40 mg total) by mouth daily. 07/22/14  Yes Collene GobbleSteven A Constancia Geeting, MD  KRILL OIL PO Take 1 tablet by mouth daily.    Yes Historical Provider, MD  Red Yeast Rice 600 MG TABS Take 600 mg by mouth daily. Daily   Yes Historical Provider, MD  oxyCODONE (ROXICODONE) 5 MG immediate release tablet Take 1 tablet (5 mg total) by mouth every 6 (six) hours as needed for severe pain. Patient not taking: Reported on 10/01/2014 09/15/14   Raymond GurneyKimberly A Trinh, PA-C     ROS: The patient denies fevers, chills, night sweats, unintentional weight loss, chest pain, palpitations, wheezing, dyspnea on exertion, nausea, vomiting, abdominal pain, dysuria, hematuria, melena, numbness, weakness, or tingling.   All other systems have been reviewed and were otherwise negative with the exception of those mentioned in the HPI and as above.    PHYSICAL EXAM: Filed Vitals:    07/15/15 0909 07/15/15 0928  BP: 160/98 140/90  Pulse: 62   Temp: 98 F (36.7 C)   Resp: 16    Body mass index is 28.66 kg/(m^2).   General: Alert, no acute distress HEENT:  Normocephalic, atraumatic, oropharynx patent. [facility] Cardiovascular:  Regular rate and rhythm, no rubs murmurs or gallops.  No Carotid bruits, radial pulse intact. No pedal edema.  Respiratory: Clear to auscultation bilaterally.  No wheezes, rales, or rhonchi.  No cyanosis, no use of accessory musculature Abdominal: No organomegaly, abdomen is soft and non-tender, positive bowel sounds.  No masses. Musculoskeletal: Gait intact. No edema, tenderness Skin: No rashes. 5mm by 6mm mild pigmented mole with no surrounding erythema on pt's mid back. Neurologic: Facial musculature symmetric. Psychiatric: Patient acts appropriately throughout our interaction. Lymphatic: No cervical or submandibular lymphadenopathy Prostates exam normal with no enlargement, no masses.    LABS:    EKG/XRAY:   Primary read [facility].   ASSESSMENT/PLAN: Blood pressure slightly elevated today. He will continue his current blood pressure medications. He is being referred to Dr. Jacinto HalimGanji  to help with cholesterol control. We'll see what his blood pressure is on that visit. He has regular follow-ups with Dr. Darrick Pennafields his cardiovascular surgeon. He would like a second opinion with a different cardiologist regarding his cholesterol problem. He [facility] for follow-up of his thyroid nodule. He is up-to-date on colonoscopy. On review of the chart he is up-to-date on ammonia vaccines.I personally performed the services described in this documentation, which was scribed in my presence. The recorded information has been reviewed and is accurate.    Gross sideeffects, risk and benefits, and alternatives of medications d/w patient. Patient is aware that all medications have potential sideeffects and we are unable  to predict every sideeffect or drug-drug interaction that may occur.  Lesle ChrisSteven Amala Petion MD 07/15/2015 9:45 AM

## 2015-07-16 LAB — PSA, MEDICARE: PSA: 2.24 ng/mL (ref <=4.00)

## 2016-03-28 ENCOUNTER — Encounter: Payer: Self-pay | Admitting: Family

## 2016-04-06 ENCOUNTER — Ambulatory Visit (HOSPITAL_COMMUNITY)
Admission: RE | Admit: 2016-04-06 | Discharge: 2016-04-06 | Disposition: A | Discharge status: Home / Self Care | Payer: Medicare Other | Source: Ambulatory Visit | Attending: Family | Admitting: Family

## 2016-04-06 ENCOUNTER — Encounter: Payer: Self-pay | Admitting: Family

## 2016-04-06 ENCOUNTER — Ambulatory Visit (INDEPENDENT_AMBULATORY_CARE_PROVIDER_SITE_OTHER): Payer: Medicare Other | Admitting: Family

## 2016-04-06 VITALS — BP 146/86 | HR 59 | Temp 97.9°F | Resp 16 | Ht 72.0 in | Wt 212.0 lb

## 2016-04-06 DIAGNOSIS — I6522 Occlusion and stenosis of left carotid artery: Secondary | ICD-10-CM | POA: Diagnosis not present

## 2016-04-06 DIAGNOSIS — I6523 Occlusion and stenosis of bilateral carotid arteries: Secondary | ICD-10-CM

## 2016-04-06 DIAGNOSIS — Z9889 Other specified postprocedural states: Secondary | ICD-10-CM | POA: Diagnosis not present

## 2016-04-06 LAB — VAS US CAROTID
LCCADDIAS: 22 cm/s
LEFT ECA DIAS: -15 cm/s
LEFT VERTEBRAL DIAS: 13 cm/s
LICADDIAS: -27 cm/s
LICAPSYS: -47 cm/s
Left CCA dist sys: 86 cm/s
Left CCA prox dias: 28 cm/s
Left CCA prox sys: 107 cm/s
Left ICA dist sys: -71 cm/s
Left ICA prox dias: -14 cm/s
RIGHT CCA MID DIAS: 16 cm/s
RIGHT ECA DIAS: -13 cm/s
RIGHT VERTEBRAL DIAS: 12 cm/s
Right CCA prox dias: 14 cm/s
Right CCA prox sys: 83 cm/s
Right cca dist sys: -86 cm/s

## 2016-04-06 NOTE — Progress Notes (Signed)
Chief Complaint: Follow up Extracranial Carotid Artery Stenosis   History of Present Illness  Eric Hahn is a 76 y.o. male who returns for follow-up of carotid artery disease. Patient previously had a right carotid endarterectomy in August 2016 by Dr. Darrick Penna.   He denies any known history of stroke or TIA. Specifically he denies a history of amaurosis fugax or monocular blindness, unilateral facial drooping, hemiplegia, or receptive or expressive aphasia.  He currently takes aspirin daily. He is unable to take statin due to previous muscle issues. Chronic medical problems include hyperlipidemia hypertension and asthma all of which are stable. He has not smoked since 1977.   Pt Diabetic: no Pt smoker: former smoker, quit in 1977  Pt meds include: Statin : no, has myalgias and arthralgias, tremors with statins, tried 6 statins ASA: yes Other anticoagulants/antiplatelets: no   Past Medical History:  Diagnosis Date  . Arthritis   . Carotid artery occlusion   . Full dentures   . History of asthma    pt. states as a child  . History of bronchitis   . History of kidney stones   . History of pneumonia   . HOH (hard of hearing)   . Hyperlipidemia   . Hypertension   . Obesity   . PONV (postoperative nausea and vomiting)    pt. states that was sick with first surgery but 2nd surgery used patch behind ears and had no issues    Social History Social History  Substance Use Topics  . Smoking status: Former Smoker    Types: Cigarettes    Quit date: 06/06/1975  . Smokeless tobacco: Never Used  . Alcohol use No    Family History Family History  Problem Relation Age of Onset  . Hyperlipidemia Father   . Arthritis Sister     Surgical History Past Surgical History:  Procedure Laterality Date  . CAROTID ENDARTERECTOMY Right 09-15-14  . ENDARTERECTOMY Right 09/15/2014   Procedure: RIGHT CAROTID ENDARTERECTOMY WITH HEMASHIELD PATCH ANGIOPLASTY ;  Surgeon: Sherren Kerns, MD;   Location: [facility] OR;  Service: Vascular;  Laterality: Right;  . NASAL SINUS SURGERY    . SPINE SURGERY     C-7 disk surgery  by Dr. Darreld Mclean     Allergies  Allergen Reactions  . Sulfa Antibiotics Other (See Comments)    Unknown  . Amoxicillin Hives  . Statins Other (See Comments)    Pt is intolerant-muscle aches and weakness    Current Outpatient Prescriptions  Medication Sig Dispense Refill  . aspirin 81 MG tablet Take 81 mg by mouth daily.      . Flaxseed, Linseed, (FLAX SEED OIL) 1000 MG CAPS Take 1 capsule by mouth daily. daily    . fosinopril (MONOPRIL) 40 MG tablet Take 1 tablet (40 mg total) by mouth daily. 90 tablet 3  . KRILL OIL PO Take 1 tablet by mouth daily.     . Red Yeast Rice 600 MG TABS Take 600 mg by mouth daily. Daily     No current facility-administered medications for this visit.     Review of Systems : See HPI for pertinent positives and negatives.  Physical Examination  Vitals:   04/06/16 1134 04/06/16 1136 04/06/16 1137  BP: (!) 145/87 138/90 (!) 146/86  Pulse: 60 60 (!) 59  Resp: 16    Temp: 97.9 F (36.6 C)    TempSrc: Oral    SpO2: 99%    Weight: 212 lb (96.2 kg)  Height: 6' (1.829 m)     Body mass index is 28.75 kg/m.  General: WDWN male in NAD GAIT: normal Eyes: PERRLA Pulmonary:  Respirations are non-labored, good air movement, CTAB, no rales, rhonchi, or wheezing.  Cardiac: regular rhythm, no detected murmur.  VASCULAR EXAM Carotid Bruits Right Left   Negative Negative    Aorta is not palpable. Radial pulses are 2+ palpable and equal.                                                                                                                            LE Pulses Right Left       POPLITEAL  not palpable   not palpable       POSTERIOR TIBIAL   palpable    palpable        DORSALIS PEDIS      ANTERIOR TIBIAL  palpable   palpable     Gastrointestinal: soft, nontender, BS WNL, no r/g, no palpable  masses.  Musculoskeletal: No muscle atrophy/wasting. M/S 5/5 throughout, extremities without ischemic changes.  Neurologic: A&O X 3; Appropriate Affect, Speech is normal CN 2-12 intact, pain and light touch intact in extremities, Motor exam as listed above.     Assessment: Eric Hahn is a 76 y.o. male who is s/p right carotid endarterectomy in August 2016. He has no history of stroke or TIA.   His atherosclerotic risk factors include dyslipidemia, statin intolerance, and former smoker (quit in 1977). Fortunately he does not have DM and stays physically active.   DATA    Plan: Follow-up in 1 year with Carotid Duplex scan.   I discussed in depth with the patient the nature of atherosclerosis, and emphasized the importance of maximal medical management including strict control of blood pressure, blood glucose, and lipid levels, obtaining regular exercise, and continued cessation of smoking.  The patient is aware that without maximal medical management the underlying atherosclerotic disease process will progress, limiting the benefit of any interventions. The patient was given information about stroke prevention and what symptoms should prompt the patient to seek immediate medical care. Thank you for allowing us to participate in this patient's care.  Charisse MarchSuzanne Nickel, RN, MSN, FNP-C [facility] [place] Office: [phone]  Clinic Physician: Darrick PennaFields  04/06/16 11:59 AM

## 2016-04-06 NOTE — Patient Instructions (Signed)
Stroke Prevention Some medical conditions and behaviors are associated with an increased chance of having a stroke. You may prevent a stroke by making healthy choices and managing medical conditions. How can I reduce my risk of having a stroke?  Stay physically active. Get at least 30 minutes of activity on most or all days.  Do not smoke. It may also be helpful to avoid exposure to secondhand smoke.  Limit alcohol use. Moderate alcohol use is considered to be:  No more than 2 drinks per day for men.  No more than 1 drink per day for nonpregnant women.  Eat healthy foods. This involves:  Eating 5 or more servings of fruits and vegetables a day.  Making dietary changes that address high blood pressure (hypertension), high cholesterol, diabetes, or obesity.  Manage your cholesterol levels.  Making food choices that are high in fiber and low in saturated fat, trans fat, and cholesterol may control cholesterol levels.  Take any prescribed medicines to control cholesterol as directed by your health care provider.  Manage your diabetes.  Controlling your carbohydrate and sugar intake is recommended to manage diabetes.  Take any prescribed medicines to control diabetes as directed by your health care provider.  Control your hypertension.  Making food choices that are low in salt (sodium), saturated fat, trans fat, and cholesterol is recommended to manage hypertension.  Ask your health care provider if you need treatment to lower your blood pressure. Take any prescribed medicines to control hypertension as directed by your health care provider.  If you are 18-39 years of age, have your blood pressure checked every 3-5 years. If you are 40 years of age or older, have your blood pressure checked every year.  Maintain a healthy weight.  Reducing calorie intake and making food choices that are low in sodium, saturated fat, trans fat, and cholesterol are recommended to manage  weight.  Stop drug abuse.  Avoid taking birth control pills.  Talk to your health care provider about the risks of taking birth control pills if you are over 35 years old, smoke, get migraines, or have ever had a blood clot.  Get evaluated for sleep disorders (sleep apnea).  Talk to your health care provider about getting a sleep evaluation if you snore a lot or have excessive sleepiness.  Take medicines only as directed by your health care provider.  For some people, aspirin or blood thinners (anticoagulants) are helpful in reducing the risk of forming abnormal blood clots that can lead to stroke. If you have the irregular heart rhythm of atrial fibrillation, you should be on a blood thinner unless there is a good reason you cannot take them.  Understand all your medicine instructions.  Make sure that other conditions (such as anemia or atherosclerosis) are addressed. Get help right away if:  You have sudden weakness or numbness of the face, arm, or leg, especially on one side of the body.  Your face or eyelid droops to one side.  You have sudden confusion.  You have trouble speaking (aphasia) or understanding.  You have sudden trouble seeing in one or both eyes.  You have sudden trouble walking.  You have dizziness.  You have a loss of balance or coordination.  You have a sudden, severe headache with no known cause.  You have new chest pain or an irregular heartbeat. Any of these symptoms may represent a serious problem that is an emergency. Do not wait to see if the symptoms will go away.   Get medical help at once. Call your local emergency services (911 in [place] Do not drive yourself to the hospital. This information is not intended to replace advice given to you by your health care provider. Make sure you discuss any questions you have with your health care provider. Document Released: 03/09/2004 Document Revised: 07/08/2015 Document Reviewed: 08/02/2012 [employer]  Interactive Patient Education  2017 [employer]  

## 2016-04-07 ENCOUNTER — Encounter: Payer: Self-pay | Admitting: Cardiology

## 2016-04-11 NOTE — Addendum Note (Signed)
Addended by: Burton ApleyPETTY, Cleston Lautner A on: 04/11/2016 01:20 PM   Modules accepted: Orders

## 2016-07-07 ENCOUNTER — Encounter: Payer: Medicare Other | Admitting: Emergency Medicine

## 2016-07-28 ENCOUNTER — Encounter: Payer: Self-pay | Admitting: Emergency Medicine

## 2016-07-28 ENCOUNTER — Ambulatory Visit (INDEPENDENT_AMBULATORY_CARE_PROVIDER_SITE_OTHER): Payer: Medicare Other | Admitting: Emergency Medicine

## 2016-07-28 VITALS — BP 155/91 | HR 62 | Temp 98.0°F | Resp 16 | Ht 74.0 in | Wt 208.8 lb

## 2016-07-28 DIAGNOSIS — E041 Nontoxic single thyroid nodule: Secondary | ICD-10-CM | POA: Diagnosis not present

## 2016-07-28 DIAGNOSIS — I10 Essential (primary) hypertension: Secondary | ICD-10-CM | POA: Diagnosis not present

## 2016-07-28 DIAGNOSIS — I6521 Occlusion and stenosis of right carotid artery: Secondary | ICD-10-CM

## 2016-07-28 DIAGNOSIS — Z0000 Encounter for general adult medical examination without abnormal findings: Secondary | ICD-10-CM | POA: Diagnosis not present

## 2016-07-28 MED ORDER — FOSINOPRIL SODIUM 40 MG PO TABS: 40.0000 mg | ORAL_TABLET | Freq: Every day | ORAL | 3 refills | Status: DC

## 2016-07-28 NOTE — Assessment & Plan Note (Signed)
Somewhat elevated today; normal at home; stable on Monopril 40 mg. Will refill medication.

## 2016-07-28 NOTE — Assessment & Plan Note (Signed)
S/P surgery; resolved.

## 2016-07-28 NOTE — Assessment & Plan Note (Signed)
States cholesterol chronically high; has tried statins but cannot tolerate side effects; diet "controlled".

## 2016-07-28 NOTE — Progress Notes (Signed)
Eric Hahn 76 y.o.   Chief Complaint  Patient presents with  . Annual Exam  . Medication Refill    fosinopril 90 day supply    HISTORY OF PRESENT ILLNESS: This is a 76 y.o. male has no complaints; stays physically active and states he feels great.   HPI    Prior to Admission medications   Medication Sig Start Date End Date Taking? Authorizing Provider  aspirin 81 MG tablet Take 81 mg by mouth daily.     Yes [provider]  Flaxseed, Linseed, (FLAX SEED OIL) 1000 MG CAPS Take 1 capsule by mouth daily. daily   Yes [provider]  fosinopril (MONOPRIL) 40 MG tablet Take 1 tablet (40 mg total) by mouth daily. 07/15/15  Yes [name], MD  KRILL OIL PO Take 1 tablet by mouth daily.    Yes [provider]  Red Yeast Rice 600 MG TABS Take 600 mg by mouth daily. Daily   Yes [provider]    Allergies  Allergen Reactions  . Sulfa Antibiotics Other (See Comments)    Unknown  . Amoxicillin Hives  . Statins Other (See Comments)    Pt is intolerant-muscle aches and weakness    Patient Active Problem List   Diagnosis Date Noted  . Thyroid nodule 10/21/2014  . Asymptomatic stenosis of right carotid artery 09/15/2014  . Other and unspecified hyperlipidemia 06/10/2012  . Abnormal ECG 06/06/2010  . Hypertension 06/06/2010    Past Medical History:  Diagnosis Date  . Arthritis   . Carotid artery occlusion   . Full dentures   . History of asthma    pt. states as a child  . History of bronchitis   . History of kidney stones   . History of pneumonia   . HOH (hard of hearing)   . Hyperlipidemia   . Hypertension   . Obesity   . PONV (postoperative nausea and vomiting)    pt. states that was sick with first surgery but 2nd surgery used patch behind ears and had no issues    Past Surgical History:  Procedure Laterality Date  . CAROTID ENDARTERECTOMY Right 09-15-14  . ENDARTERECTOMY Right 09/15/2014   Procedure: RIGHT CAROTID  ENDARTERECTOMY WITH HEMASHIELD PATCH ANGIOPLASTY ;  Surgeon: Sherren Kerns, MD;  Location: [facility] OR;  Service: Vascular;  Laterality: Right;  . NASAL SINUS SURGERY    . SPINE SURGERY     C-7 disk surgery  by Dr. Darreld Mclean     Social History   Social History  . Marital status: Married    Spouse name: N/A  . Number of children: N/A  . Years of education: N/A   Occupational History  . Not on file.   Social History Main Topics  . Smoking status: Former Smoker    Types: Cigarettes    Quit date: 06/06/1975  . Smokeless tobacco: Never Used  . Alcohol use No  . Drug use: No  . Sexual activity: Not on file   Other Topics Concern  . Not on file   Social History Narrative   Married   Education: High School   Exercise: Yes    Family History  Problem Relation Age of Onset  . Hyperlipidemia Father   . Arthritis Sister      Review of Systems  Constitutional: Negative.  Negative for chills, fever, malaise/fatigue and weight loss.  HENT: Negative.  Negative for ear pain, nosebleeds and sore throat.   Eyes: Negative.  Negative for blurred vision, double vision, discharge and redness.  Respiratory: Negative.  Negative for cough, shortness of breath and wheezing.   Cardiovascular: Negative.  Negative for chest pain, palpitations, claudication, leg swelling and PND.  Gastrointestinal: Negative.  Negative for abdominal pain, blood in stool, constipation, diarrhea, melena, nausea and vomiting.  Genitourinary: Negative.  Negative for dysuria and hematuria.  Musculoskeletal: Negative.  Negative for back pain, myalgias and neck pain.  Skin: Negative.  Negative for rash.  Neurological: Negative.  Negative for dizziness, sensory change, focal weakness, seizures and headaches.  Endo/Heme/Allergies: Negative.   All other systems reviewed and are negative.  Vitals:   07/28/16 0905  BP: (!) 155/91  Pulse: 62  Resp: 16  Temp: 98 F (36.7 C)     Physical Exam  Constitutional: He  is oriented to person, place, and time. He appears well-developed and well-nourished.  HENT:  Head: Normocephalic and atraumatic.  Right Ear: External ear normal.  Left Ear: External ear normal.  Nose: Nose normal.  Mouth/Throat: Oropharynx is clear and moist.  Eyes: Conjunctivae and EOM are normal. Pupils are equal, round, and reactive to light.  Neck: Normal range of motion. Neck supple. Carotid bruit is not present.    Cardiovascular: Normal rate, regular rhythm, normal heart sounds and intact distal pulses.   Pulmonary/Chest: Effort normal and breath sounds normal.  Abdominal: Soft. Bowel sounds are normal. He exhibits no distension. There is no tenderness.  Musculoskeletal: Normal range of motion. He exhibits no edema or tenderness.  Neurological: He is alert and oriented to person, place, and time. No sensory deficit. He exhibits normal muscle tone.  Skin: Skin is warm and dry. Capillary refill takes less than 2 seconds. No rash noted.  Psychiatric: He has a normal mood and affect. His behavior is normal.  Vitals reviewed.    ASSESSMENT & PLAN: Hypertension Somewhat elevated today; normal at home; stable on Monopril 40 mg. Will refill medication.  Asymptomatic stenosis of right carotid artery S/P surgery; resolved.  Thyroid nodule Stable; was seen x f/u 6 months ago.  Other and unspecified hyperlipidemia States cholesterol chronically high; has tried statins but cannot tolerate side effects; diet "controlled".    Patient Instructions       IF you received an x-ray today, you will receive an invoice from [facility]. Please contact [facility] at [phone] with questions or concerns regarding your invoice.   IF you received labwork today, you will receive an invoice from [facility]. Please contact [facility] at [phone] with questions or concerns regarding your invoice.   Our billing staff will not be able to assist you with questions regarding  bills from these companies.  You will be contacted with the lab results as soon as they are available. The fastest way to get your results is to activate your My Chart account. Instructions are located on the last page of this paperwork. If you have not heard from [place] regarding the results in 2 weeks, please contact this office.        Health Maintenance, Male A healthy lifestyle and preventive care is important for your health and wellness. Ask your health care provider about what schedule of regular examinations is right for you. What should I know about weight and diet? Eat a Healthy Diet  Eat plenty of vegetables, fruits, whole grains, low-fat dairy products, and lean protein.  Do not eat a lot of foods high in solid fats, added sugars, or salt.  Maintain a Healthy Weight Regular  exercise can help you achieve or maintain a healthy weight. You should:  Do at least 150 minutes of exercise each week. The exercise should increase your heart rate and make you sweat (moderate-intensity exercise).  Do strength-training exercises at least twice a week.  Watch Your Levels of Cholesterol and Blood Lipids  Have your blood tested for lipids and cholesterol every 5 years starting at 75 years of age. If you are at high risk for heart disease, you should start having your blood tested when you are 75 years old. You may need to have your cholesterol levels checked more often if: ? Your lipid or cholesterol levels are high. ? You are older than 75 years of age. ? You are at high risk for heart disease.  What should I know about cancer screening? Many types of cancers can be detected early and may often be prevented. Lung Cancer  You should be screened every year for lung cancer if: ? You are a current smoker who has smoked for at least 30 years. ? You are a former smoker who has quit within the past 15 years.  Talk to your health care provider about your screening options, when you should  start screening, and how often you should be screened.  Colorectal Cancer  Routine colorectal cancer screening usually begins at 75 years of age and should be repeated every 5-10 years until you are 75 years old. You may need to be screened more often if early forms of precancerous polyps or small growths are found. Your health care provider may recommend screening at an earlier age if you have risk factors for colon cancer.  Your health care provider may recommend using home test kits to check for hidden blood in the stool.  A small camera at the end of a tube can be used to examine your colon (sigmoidoscopy or colonoscopy). This checks for the earliest forms of colorectal cancer.  Prostate and Testicular Cancer  Depending on your age and overall health, your health care provider may do certain tests to screen for prostate and testicular cancer.  Talk to your health care provider about any symptoms or concerns you have about testicular or prostate cancer.  Skin Cancer  Check your skin from head to toe regularly.  Tell your health care provider about any new moles or changes in moles, especially if: ? There is a change in a mole's size, shape, or color. ? You have a mole that is larger than a pencil eraser.  Always use sunscreen. Apply sunscreen liberally and repeat throughout the day.  Protect yourself by wearing long sleeves, pants, a wide-brimmed hat, and sunglasses when outside.  What should I know about heart disease, diabetes, and high blood pressure?  If you are 2818-75 years of age, have your blood pressure checked every 3-5 years. If you are 75 years of age or older, have your blood pressure checked every year. You should have your blood pressure measured twice-once when you are at a hospital or clinic, and once when you are not at a hospital or clinic. Record the average of the two measurements. To check your blood pressure when you are not at a hospital or clinic, you can  use: ? An automated blood pressure machine at a pharmacy. ? A home blood pressure monitor.  Talk to your health care provider about your target blood pressure.  If you are between 3045-75 years old, ask your health care provider if you should take aspirin to  prevent heart disease.  Have regular diabetes screenings by checking your fasting blood sugar level. ? If you are at a normal weight and have a low risk for diabetes, have this test once every three years after the age of 62. ? If you are overweight and have a high risk for diabetes, consider being tested at a younger age or more often.  A one-time screening for abdominal aortic aneurysm (AAA) by ultrasound is recommended for men aged 65-75 years who are current or former smokers. What should I know about preventing infection? Hepatitis B If you have a higher risk for hepatitis B, you should be screened for this virus. Talk with your health care provider to find out if you are at risk for hepatitis B infection. Hepatitis C Blood testing is recommended for:  Everyone born from [birth year] through 1964.  Anyone with known risk factors for hepatitis C.  Sexually Transmitted Diseases (STDs)  You should be screened each year for STDs including gonorrhea and chlamydia if: ? You are sexually active and are younger than 75 years of age. ? You are older than 75 years of age and your health care provider tells you that you are at risk for this type of infection. ? Your sexual activity has changed since you were last screened and you are at an increased risk for chlamydia or gonorrhea. Ask your health care provider if you are at risk.  Talk with your health care provider about whether you are at high risk of being infected with HIV. Your health care provider may recommend a prescription medicine to help prevent HIV infection.  What else can I do?  Schedule regular health, dental, and eye exams.  Stay current with your vaccines  (immunizations).  Do not use any tobacco products, such as cigarettes, chewing tobacco, and e-cigarettes. If you need help quitting, ask your health care provider.  Limit alcohol intake to no more than 2 drinks per day. One drink equals 12 ounces of beer, 5 ounces of wine, or 1 ounces of hard liquor.  Do not use street drugs.  Do not share needles.  Ask your health care provider for help if you need support or information about quitting drugs.  Tell your health care provider if you often feel depressed.  Tell your health care provider if you have ever been abused or do not feel safe at home. This information is not intended to replace advice given to you by your health care provider. Make sure you discuss any questions you have with your health care provider. Document Released: 07/29/2007 Document Revised: 09/29/2015 Document Reviewed: 11/03/2014 Elsevier Interactive Patient Education  2018 [employer].  Fat and Cholesterol Restricted Diet High levels of fat and cholesterol in your blood may lead to various health problems, such as diseases of the heart, blood vessels, gallbladder, liver, and pancreas. Fats are concentrated sources of energy that come in various forms. Certain types of fat, including saturated fat, may be harmful in excess. Cholesterol is a substance needed by your body in small amounts. Your body makes all the cholesterol it needs. Excess cholesterol comes from the food you eat. When you have high levels of cholesterol and saturated fat in your blood, health problems can develop because the excess fat and cholesterol will gather along the walls of your blood vessels, causing them to narrow. Choosing the right foods will help you control your intake of fat and cholesterol. This will help keep the levels of these substances in your  blood within normal limits and reduce your risk of disease. What is my plan? Your health care provider recommends that you:  Limit your fat  intake to ______% or less of your total calories per day.  Limit the amount of cholesterol in your diet to less than _________mg per day.  Eat 20-30 grams of fiber each day.  What types of fat should I choose?  Choose healthy fats more often. Choose monounsaturated and polyunsaturated fats, such as olive and canola oil, flaxseeds, walnuts, almonds, and seeds.  Eat more omega-3 fats. Good choices include salmon, mackerel, sardines, tuna, flaxseed oil, and ground flaxseeds. Aim to eat fish at least two times a week.  Limit saturated fats. Saturated fats are primarily found in animal products, such as meats, butter, and cream. Plant sources of saturated fats include palm oil, palm kernel oil, and coconut oil.  Avoid foods with partially hydrogenated oils in them. These contain trans fats. Examples of foods that contain trans fats are stick margarine, some tub margarines, cookies, crackers, and other baked goods. What general guidelines do I need to follow? These guidelines for healthy eating will help you control your intake of fat and cholesterol:  Check food labels carefully to identify foods with trans fats or high amounts of saturated fat.  Fill one half of your plate with vegetables and green salads.  Fill one fourth of your plate with whole grains. Look for the word "whole" as the first word in the ingredient list.  Fill one fourth of your plate with lean protein foods.  Limit fruit to two servings a day. Choose fruit instead of juice.  Eat more foods that contain fiber, such as apples, broccoli, carrots, beans, peas, and barley.  Eat more home-cooked food and less restaurant, buffet, and fast food.  Limit or avoid alcohol.  Limit foods high in starch and sugar.  Limit fried foods.  Cook foods using methods other than frying. Baking, boiling, grilling, and broiling are all great options.  Lose weight if you are overweight. Losing just 5-10% of your initial body weight can  help your overall health and prevent diseases such as diabetes and heart disease.  What foods can I eat? Grains  Whole grains, such as whole wheat or whole grain breads, crackers, cereals, and pasta. Unsweetened oatmeal, bulgur, barley, quinoa, or brown rice. Corn or whole wheat flour tortillas. Vegetables  Fresh or frozen vegetables (raw, steamed, roasted, or grilled). Green salads. Fruits  All fresh, canned (in natural juice), or frozen fruits. Meats and other protein foods  Ground beef (85% or leaner), grass-fed beef, or beef trimmed of fat. Skinless chicken or [place]. Ground chicken or [place]. Pork trimmed of fat. All fish and seafood. Eggs. Dried beans, peas, or lentils. Unsalted nuts or seeds. Unsalted canned or dry beans. Dairy  Low-fat dairy products, such as skim or 1% milk, 2% or reduced-fat cheeses, low-fat ricotta or cottage cheese, or plain low-fat yo Fats and oils  Tub margarines without trans fats. Light or reduced-fat mayonnaise and salad dressings. Avocado. Olive, canola, sesame, or safflower oils. Natural peanut or almond butter (choose ones without added sugar and oil). The items listed above may not be a complete list of recommended foods or beverages. Contact your dietitian for more options. Foods to avoid Grains  White bread. White pasta. White rice. Cornbread. Bagels, pastries, and croissants. Crackers that contain trans fat. Vegetables  White potatoes. Corn. Creamed or fried vegetables. Vegetables in a cheese sauce. Fruits  Dried fruits.  Canned fruit in light or heavy syrup. Fruit juice. Meats and other protein foods  Fatty cuts of meat. Ribs, chicken wings, bacon, sausage, bologna, salami, chitterlings, fatback, hot dogs, bratwurst, and packaged luncheon meats. Liver and organ meats. Dairy  Whole or 2% milk, cream, half-and-half, and cream cheese. Whole milk cheeses. Whole-fat or sweetened yogurt. Full-fat cheeses. Nondairy creamers and whipped toppings.  Processed cheese, cheese spreads, or cheese curds. Beverages  Alcohol. Sweetened drinks (such as sodas, lemonade, and fruit drinks or punches). Fats and oils  Butter, stick margarine, lard, shortening, ghee, or bacon fat. Coconut, palm kernel, or palm oils. Sweets and desserts  Corn syrup, sugars, honey, and molasses. Candy. Jam and jelly. Syrup. Sweetened cereals. Cookies, pies, cakes, donuts, muffins, and ice cream. The items listed above may not be a complete list of foods and beverages to avoid. Contact your dietitian for more information. This information is not intended to replace advice given to you by your health care provider. Make sure you discuss any questions you have with your health care provider. Document Released: 01/30/2005 Document Revised: 02/20/2014 Document Reviewed: 04/30/2013 Elsevier Interactive Patient Education  2017 Elsevier Inc.  American Heart Association [facility]) Exercise Recommendation  Being physically active is important to prevent heart disease and stroke, the nation's No. 1and No. 5killers. To improve overall cardiovascular health, we suggest at least 150 minutes per week of moderate exercise or 75 minutes per week of vigorous exercise (or a combination of moderate and vigorous activity). Thirty minutes a day, five times a week is an easy goal to remember. You will also experience benefits even if you divide your time into two or three segments of 10 to 15 minutes per day.  For people who would benefit from lowering their blood pressure or cholesterol, we recommend 40 minutes of aerobic exercise of moderate to vigorous intensity three to four times a week to lower the risk for heart attack and stroke.  Physical activity is anything that makes you move your body and burn calories.  This includes things like climbing stairs or playing sports. Aerobic exercises benefit your heart, and include walking, jogging, swimming or biking. Strength and stretching exercises  are best for overall stamina and flexibility.  The simplest, positive change you can make to effectively improve your heart health is to start walking. It's enjoyable, free, easy, social and great exercise. A walking program is flexible and boasts high success rates because people can stick with it. It's easy for walking to become a regular and satisfying part of life.   For Overall Cardiovascular Health:  At least 30 minutes of moderate-intensity aerobic activity at least 5 days per week for a total of 150  OR   At least 25 minutes of vigorous aerobic activity at least 3 days per week for a total of 75 minutes; or a combination of moderate- and vigorous-intensity aerobic activity  AND   Moderate- to high-intensity muscle-strengthening activity at least 2 days per week for additional health benefits.  For Lowering Blood Pressure and Cholesterol  An average 40 minutes of moderate- to vigorous-intensity aerobic activity 3 or 4 times per week  What if I can't make it to the time goal? Something is always better than nothing! And everyone has to start somewhere. Even if you've been sedentary for years, today is the day you can begin to make healthy changes in your life. If you don't think you'll make it for 30 or 40 minutes, set a reachable goal for today. You  can work up toward your overall goal by increasing your time as you get stronger. Don't let all-or-nothing thinking rob you of doing what you can every day.  Source:[email or web address] MD [facility]

## 2016-07-28 NOTE — Patient Instructions (Addendum)
IF you received an x-ray today, you will receive an invoice from [facility]. Please contact [facility] at [phone] with questions or concerns regarding your invoice.   IF you received labwork today, you will receive an invoice from [facility]. Please contact [facility] at [phone] with questions or concerns regarding your invoice.   Our billing staff will not be able to assist you with questions regarding bills from these companies.  You will be contacted with the lab results as soon as they are available. The fastest way to get your results is to activate your My Chart account. Instructions are located on the last page of this paperwork. If you have not heard from [place] regarding the results in 2 weeks, please contact this office.        Health Maintenance, Male A healthy lifestyle and preventive care is important for your health and wellness. Ask your health care provider about what schedule of regular examinations is right for you. What should I know about weight and diet? Eat a Healthy Diet  Eat plenty of vegetables, fruits, whole grains, low-fat dairy products, and lean protein.  Do not eat a lot of foods high in solid fats, added sugars, or salt.  Maintain a Healthy Weight Regular exercise can help you achieve or maintain a healthy weight. You should:  Do at least 150 minutes of exercise each week. The exercise should increase your heart rate and make you sweat (moderate-intensity exercise).  Do strength-training exercises at least twice a week.  Watch Your Levels of Cholesterol and Blood Lipids  Have your blood tested for lipids and cholesterol every 5 years starting at 75 years of age. If you are at high risk for heart disease, you should start having your blood tested when you are 75 years old. You may need to have your cholesterol levels checked more often if: ? Your lipid or cholesterol levels are high. ? You are older than 75 years of  age. ? You are at high risk for heart disease.  What should I know about cancer screening? Many types of cancers can be detected early and may often be prevented. Lung Cancer  You should be screened every year for lung cancer if: ? You are a current smoker who has smoked for at least 30 years. ? You are a former smoker who has quit within the past 15 years.  Talk to your health care provider about your screening options, when you should start screening, and how often you should be screened.  Colorectal Cancer  Routine colorectal cancer screening usually begins at 75 years of age and should be repeated every 5-10 years until you are 75 years old. You may need to be screened more often if early forms of precancerous polyps or small growths are found. Your health care provider may recommend screening at an earlier age if you have risk factors for colon cancer.  Your health care provider may recommend using home test kits to check for hidden blood in the stool.  A small camera at the end of a tube can be used to examine your colon (sigmoidoscopy or colonoscopy). This checks for the earliest forms of colorectal cancer.  Prostate and Testicular Cancer  Depending on your age and overall health, your health care provider may do certain tests to screen for prostate and testicular cancer.  Talk to your health care provider about any symptoms or concerns you have about testicular or prostate cancer.  Skin Cancer  Check  your skin from head to toe regularly.  Tell your health care provider about any new moles or changes in moles, especially if: ? There is a change in a mole's size, shape, or color. ? You have a mole that is larger than a pencil eraser.  Always use sunscreen. Apply sunscreen liberally and repeat throughout the day.  Protect yourself by wearing long sleeves, pants, a wide-brimmed hat, and sunglasses when outside.  What should I know about heart disease, diabetes, and high  blood pressure?  If you are 18-39 years of age, have your blood pressure checked every 3-5 years. If you are 40 years of age or older, have your blood pressure checked every year. You should have your blood pressure measured twice-once when you are at a hospital or clinic, and once when you are not at a hospital or clinic. Record the average of the two measurements. To check your blood pressure when you are not at a hospital or clinic, you can use: ? An automated blood pressure machine at a pharmacy. ? A home blood pressure monitor.  Talk to your health care provider about your target blood pressure.  If you are between 45-79 years old, ask your health care provider if you should take aspirin to prevent heart disease.  Have regular diabetes screenings by checking your fasting blood sugar level. ? If you are at a normal weight and have a low risk for diabetes, have this test once every three years after the age of 45. ? If you are overweight and have a high risk for diabetes, consider being tested at a younger age or more often.  A one-time screening for abdominal aortic aneurysm (AAA) by ultrasound is recommended for men aged 65-75 years who are current or former smokers. What should I know about preventing infection? Hepatitis B If you have a higher risk for hepatitis B, you should be screened for this virus. Talk with your health care provider to find out if you are at risk for hepatitis B infection. Hepatitis C Blood testing is recommended for:  Everyone born from 1945 through 1965.  Anyone with known risk factors for hepatitis C.  Sexually Transmitted Diseases (STDs)  You should be screened each year for STDs including gonorrhea and chlamydia if: ? You are sexually active and are younger than 75 years of age. ? You are older than 75 years of age and your health care provider tells you that you are at risk for this type of infection. ? Your sexual activity has changed since you were  last screened and you are at an increased risk for chlamydia or gonorrhea. Ask your health care provider if you are at risk.  Talk with your health care provider about whether you are at high risk of being infected with HIV. Your health care provider may recommend a prescription medicine to help prevent HIV infection.  What else can I do?  Schedule regular health, dental, and eye exams.  Stay current with your vaccines (immunizations).  Do not use any tobacco products, such as cigarettes, chewing tobacco, and e-cigarettes. If you need help quitting, ask your health care provider.  Limit alcohol intake to no more than 2 drinks per day. One drink equals 12 ounces of beer, 5 ounces of wine, or 1 ounces of hard liquor.  Do not use street drugs.  Do not share needles.  Ask your health care provider for help if you need support or information about quitting drugs.  Tell your   health care provider if you often feel depressed.  Tell your health care provider if you have ever been abused or do not feel safe at home. This information is not intended to replace advice given to you by your health care provider. Make sure you discuss any questions you have with your health care provider. Document Released: 07/29/2007 Document Revised: 09/29/2015 Document Reviewed: 11/03/2014 Elsevier Interactive Patient Education  2018 [employer].  Fat and Cholesterol Restricted Diet High levels of fat and cholesterol in your blood may lead to various health problems, such as diseases of the heart, blood vessels, gallbladder, liver, and pancreas. Fats are concentrated sources of energy that come in various forms. Certain types of fat, including saturated fat, may be harmful in excess. Cholesterol is a substance needed by your body in small amounts. Your body makes all the cholesterol it needs. Excess cholesterol comes from the food you eat. When you have high levels of cholesterol and saturated fat in your blood,  health problems can develop because the excess fat and cholesterol will gather along the walls of your blood vessels, causing them to narrow. Choosing the right foods will help you control your intake of fat and cholesterol. This will help keep the levels of these substances in your blood within normal limits and reduce your risk of disease. What is my plan? Your health care provider recommends that you:  Limit your fat intake to ______% or less of your total calories per day.  Limit the amount of cholesterol in your diet to less than _________mg per day.  Eat 20-30 grams of fiber each day.  What types of fat should I choose?  Choose healthy fats more often. Choose monounsaturated and polyunsaturated fats, such as olive and canola oil, flaxseeds, walnuts, almonds, and seeds.  Eat more omega-3 fats. Good choices include salmon, mackerel, sardines, tuna, flaxseed oil, and ground flaxseeds. Aim to eat fish at least two times a week.  Limit saturated fats. Saturated fats are primarily found in animal products, such as meats, butter, and cream. Plant sources of saturated fats include palm oil, palm kernel oil, and coconut oil.  Avoid foods with partially hydrogenated oils in them. These contain trans fats. Examples of foods that contain trans fats are stick margarine, some tub margarines, cookies, crackers, and other baked goods. What general guidelines do I need to follow? These guidelines for healthy eating will help you control your intake of fat and cholesterol:  Check food labels carefully to identify foods with trans fats or high amounts of saturated fat.  Fill one half of your plate with vegetables and green salads.  Fill one fourth of your plate with whole grains. Look for the word "whole" as the first word in the ingredient list.  Fill one fourth of your plate with lean protein foods.  Limit fruit to two servings a day. Choose fruit instead of juice.  Eat more foods that contain  fiber, such as apples, broccoli, carrots, beans, peas, and barley.  Eat more home-cooked food and less restaurant, buffet, and fast food.  Limit or avoid alcohol.  Limit foods high in starch and sugar.  Limit fried foods.  Cook foods using methods other than frying. Baking, boiling, grilling, and broiling are all great options.  Lose weight if you are overweight. Losing just 5-10% of your initial body weight can help your overall health and prevent diseases such as diabetes and heart disease.  What foods can I eat? Grains  Whole grains, such  as whole wheat or whole grain breads, crackers, cereals, and pasta. Unsweetened oatmeal, bulgur, barley, quinoa, or brown rice. Corn or whole wheat flour tortillas. Vegetables  Fresh or frozen vegetables (raw, steamed, roasted, or grilled). Green salads. Fruits  All fresh, canned (in natural juice), or frozen fruits. Meats and other protein foods  Ground beef (85% or leaner), grass-fed beef, or beef trimmed of fat. Skinless chicken or [place]. Ground chicken or [place]. Pork trimmed of fat. All fish and seafood. Eggs. Dried beans, peas, or lentils. Unsalted nuts or seeds. Unsalted canned or dry beans. Dairy  Low-fat dairy products, such as skim or 1% milk, 2% or reduced-fat cheeses, low-fat ricotta or cottage cheese, or plain low-fat yo Fats and oils  Tub margarines without trans fats. Light or reduced-fat mayonnaise and salad dressings. Avocado. Olive, canola, sesame, or safflower oils. Natural peanut or almond butter (choose ones without added sugar and oil). The items listed above may not be a complete list of recommended foods or beverages. Contact your dietitian for more options. Foods to avoid Grains  White bread. White pasta. White rice. Cornbread. Bagels, pastries, and croissants. Crackers that contain trans fat. Vegetables  White potatoes. Corn. Creamed or fried vegetables. Vegetables in a cheese sauce. Fruits  Dried fruits.  Canned fruit in light or heavy syrup. Fruit juice. Meats and other protein foods  Fatty cuts of meat. Ribs, chicken wings, bacon, sausage, bologna, salami, chitterlings, fatback, hot dogs, bratwurst, and packaged luncheon meats. Liver and organ meats. Dairy  Whole or 2% milk, cream, half-and-half, and cream cheese. Whole milk cheeses. Whole-fat or sweetened yogurt. Full-fat cheeses. Nondairy creamers and whipped toppings. Processed cheese, cheese spreads, or cheese curds. Beverages  Alcohol. Sweetened drinks (such as sodas, lemonade, and fruit drinks or punches). Fats and oils  Butter, stick margarine, lard, shortening, ghee, or bacon fat. Coconut, palm kernel, or palm oils. Sweets and desserts  Corn syrup, sugars, honey, and molasses. Candy. Jam and jelly. Syrup. Sweetened cereals. Cookies, pies, cakes, donuts, muffins, and ice cream. The items listed above may not be a complete list of foods and beverages to avoid. Contact your dietitian for more information. This information is not intended to replace advice given to you by your health care provider. Make sure you discuss any questions you have with your health care provider. Document Released: 01/30/2005 Document Revised: 02/20/2014 Document Reviewed: 04/30/2013 Elsevier Interactive Patient Education  2017 Elsevier Inc.  American Heart Association [facility]) Exercise Recommendation  Being physically active is important to prevent heart disease and stroke, the nation's No. 1and No. 5killers. To improve overall cardiovascular health, we suggest at least 150 minutes per week of moderate exercise or 75 minutes per week of vigorous exercise (or a combination of moderate and vigorous activity). Thirty minutes a day, five times a week is an easy goal to remember. You will also experience benefits even if you divide your time into two or three segments of 10 to 15 minutes per day.  For people who would benefit from lowering their blood pressure or  cholesterol, we recommend 40 minutes of aerobic exercise of moderate to vigorous intensity three to four times a week to lower the risk for heart attack and stroke.  Physical activity is anything that makes you move your body and burn calories.  This includes things like climbing stairs or playing sports. Aerobic exercises benefit your heart, and include walking, jogging, swimming or biking. Strength and stretching exercises are best for overall stamina and flexibility.  The simplest,  positive change you can make to effectively improve your heart health is to start walking. It's enjoyable, free, easy, social and great exercise. A walking program is flexible and boasts high success rates because people can stick with it. It's easy for walking to become a regular and satisfying part of life.   For Overall Cardiovascular Health:  At least 30 minutes of moderate-intensity aerobic activity at least 5 days per week for a total of 150  OR   At least 25 minutes of vigorous aerobic activity at least 3 days per week for a total of 75 minutes; or a combination of moderate- and vigorous-intensity aerobic activity  AND   Moderate- to high-intensity muscle-strengthening activity at least 2 days per week for additional health benefits.  For Lowering Blood Pressure and Cholesterol  An average 40 minutes of moderate- to vigorous-intensity aerobic activity 3 or 4 times per week  What if I can't make it to the time goal? Something is always better than nothing! And everyone has to start somewhere. Even if you've been sedentary for years, today is the day you can begin to make healthy changes in your life. If you don't think you'll make it for 30 or 40 minutes, set a reachable goal for today. You can work up toward your overall goal by increasing your time as you get stronger. Don't let all-or-nothing thinking rob you of doing what you can every day.  Source:http://www.heart.org

## 2016-07-28 NOTE — Assessment & Plan Note (Signed)
Stable; was seen x f/u 6 months ago.

## 2016-07-29 LAB — COMPREHENSIVE METABOLIC PANEL
A/G RATIO: 1.6 (ref 1.2–2.2)
ALBUMIN: 4.9 g/dL — AB (ref 3.5–4.8)
ALT: 26 IU/L (ref 0–44)
AST: 24 IU/L (ref 0–40)
Alkaline Phosphatase: 75 IU/L (ref 39–117)
BUN / CREAT RATIO: 14 (ref 10–24)
BUN: 13 mg/dL (ref 8–27)
Bilirubin Total: 0.5 mg/dL (ref 0.0–1.2)
CALCIUM: 9.9 mg/dL (ref 8.6–10.2)
CO2: 22 mmol/L (ref 20–29)
Chloride: 103 mmol/L (ref 96–106)
Creatinine, Ser: 0.93 mg/dL (ref 0.76–1.27)
GFR, EST AFRICAN AMERICAN: 93 mL/min/{1.73_m2} (ref >59)
GFR, EST NON AFRICAN AMERICAN: 81 mL/min/{1.73_m2} (ref >59)
GLOBULIN, TOTAL: 3.1 g/dL (ref 1.5–4.5)
Glucose: 104 mg/dL — ABNORMAL HIGH (ref 65–99)
POTASSIUM: 4.3 mmol/L (ref 3.5–5.2)
SODIUM: 142 mmol/L (ref 134–144)
TOTAL PROTEIN: 8 g/dL (ref 6.0–8.5)

## 2016-07-29 LAB — CBC WITH DIFFERENTIAL/PLATELET
BASOS: 1 %
Basophils Absolute: 0 10*3/uL (ref 0.0–0.2)
EOS (ABSOLUTE): 0.4 10*3/uL (ref 0.0–0.4)
Eos: 6 %
HEMOGLOBIN: 15.3 g/dL (ref 13.0–17.7)
Hematocrit: 45.1 % (ref 37.5–51.0)
IMMATURE GRANS (ABS): 0 10*3/uL (ref 0.0–0.1)
IMMATURE GRANULOCYTES: 0 %
LYMPHS: 36 %
Lymphocytes Absolute: 2.2 10*3/uL (ref 0.7–3.1)
MCH: 30.4 pg (ref 26.6–33.0)
MCHC: 33.9 g/dL (ref 31.5–35.7)
MCV: 90 fL (ref 79–97)
Monocytes Absolute: 0.4 10*3/uL (ref 0.1–0.9)
Monocytes: 6 %
NEUTROS PCT: 51 %
Neutrophils Absolute: 3.1 10*3/uL (ref 1.4–7.0)
Platelets: 283 10*3/uL (ref 150–379)
RBC: 5.04 x10E6/uL (ref 4.14–5.80)
RDW: 14 % (ref 12.3–15.4)
WBC: 6.1 10*3/uL (ref 3.4–10.8)

## 2016-07-29 LAB — LIPID PANEL
CHOLESTEROL TOTAL: 293 mg/dL — AB (ref 100–199)
Chol/HDL Ratio: 5.6 ratio — ABNORMAL HIGH (ref 0.0–5.0)
HDL: 52 mg/dL (ref >39)
LDL CALC: 203 mg/dL — AB (ref 0–99)
Triglycerides: 191 mg/dL — ABNORMAL HIGH (ref 0–149)
VLDL CHOLESTEROL CAL: 38 mg/dL (ref 5–40)

## 2016-07-29 LAB — HEMOGLOBIN A1C
Est. average glucose Bld gHb Est-mCnc: 123 mg/dL
Hgb A1c MFr Bld: 5.9 % — ABNORMAL HIGH (ref 4.8–5.6)

## 2017-04-06 ENCOUNTER — Encounter (HOSPITAL_COMMUNITY): Payer: Medicare Other

## 2017-04-06 ENCOUNTER — Ambulatory Visit: Payer: Medicare Other | Admitting: Family

## 2017-05-04 ENCOUNTER — Ambulatory Visit (HOSPITAL_COMMUNITY)
Admission: RE | Admit: 2017-05-04 | Discharge: 2017-05-04 | Disposition: A | Discharge status: Home / Self Care | Payer: Medicare Other | Source: Ambulatory Visit | Attending: Family | Admitting: Family

## 2017-05-04 ENCOUNTER — Encounter: Payer: Self-pay | Admitting: Family

## 2017-05-04 ENCOUNTER — Ambulatory Visit: Payer: Medicare Other | Admitting: Family

## 2017-05-04 VITALS — BP 111/74 | HR 63 | Temp 97.4°F | Resp 16 | Ht 74.0 in | Wt 215.6 lb

## 2017-05-04 DIAGNOSIS — E785 Hyperlipidemia, unspecified: Secondary | ICD-10-CM | POA: Diagnosis not present

## 2017-05-04 DIAGNOSIS — Z87891 Personal history of nicotine dependence: Secondary | ICD-10-CM | POA: Insufficient documentation

## 2017-05-04 DIAGNOSIS — I6523 Occlusion and stenosis of bilateral carotid arteries: Secondary | ICD-10-CM | POA: Diagnosis not present

## 2017-05-04 DIAGNOSIS — I10 Essential (primary) hypertension: Secondary | ICD-10-CM | POA: Insufficient documentation

## 2017-05-04 DIAGNOSIS — Z9889 Other specified postprocedural states: Secondary | ICD-10-CM

## 2017-05-04 NOTE — Patient Instructions (Signed)

## 2017-05-04 NOTE — Progress Notes (Signed)
Chief Complaint: Follow up Extracranial Carotid Artery Stenosis   History of Present Illness  Eric Hahn is a 77 y.o. male who returns for follow-up of carotid artery disease. Patient previously had a right carotid endarterectomy in August 2016 by Dr. Darrick PennaFields.   He denies any known history of stroke or TIA. Specifically he deniesa history of amaurosis fugax or monocular blindness, unilateral facial drooping, hemiplegia, orreceptive or expressive aphasia.  He currently takes aspirin daily. He is unable to take statin due to previous muscle issues. Chronic medical problems include hyperlipidemia hypertension and asthma all of which are stable. He has not smoked since 1977.   Pt Diabetic: no Pt smoker: former smoker, quit in 1977  Pt meds include: Statin : no, has myalgias and arthralgias, tremors with statins, tried 6 statins, states his cholesterol remains high  ASA: yes Other anticoagulants/antiplatelets: no    Past Medical History:  Diagnosis Date  . Arthritis   . Carotid artery occlusion   . Full dentures   . History of asthma    pt. states as a child  . History of bronchitis   . History of kidney stones   . History of pneumonia   . HOH (hard of hearing)   . Hyperlipidemia   . Hypertension   . Obesity   . PONV (postoperative nausea and vomiting)    pt. states that was sick with first surgery but 2nd surgery used patch behind ears and had no issues    Social History Social History   Tobacco Use  . Smoking status: Former Smoker    Types: Cigarettes    Last attempt to quit: 06/06/1975    Years since quitting: 41.9  . Smokeless tobacco: Never Used  Substance Use Topics  . Alcohol use: No  . Drug use: No    Family History Family History  Problem Relation Age of Onset  . Hyperlipidemia Father   . Arthritis Sister     Surgical History Past Surgical History:  Procedure Laterality Date  . CAROTID ENDARTERECTOMY Right 09-15-14  . ENDARTERECTOMY Right  09/15/2014   Procedure: RIGHT CAROTID ENDARTERECTOMY WITH HEMASHIELD PATCH ANGIOPLASTY ;  Surgeon: Sherren Kernsharles E Fields, MD;  [facility];  Service: Vascular;  Laterality: Right;  . NASAL SINUS SURGERY    . SPINE SURGERY     C-7 disk surgery  by Dr. Darreld McleanPhilip Deaton     Allergies  Allergen Reactions  . Amoxicillin Hives and Rash  . Sulfa Antibiotics Other (See Comments)    Unknown  . Statins Other (See Comments)    Pt is intolerant-muscle aches and weakness    Current Outpatient Medications  Medication Sig Dispense Refill  . aspirin 81 MG tablet Take 81 mg by mouth daily.      . Flaxseed, Linseed, (FLAX SEED OIL) 1000 MG CAPS Take 1 capsule by mouth daily. daily    . fosinopril (MONOPRIL) 40 MG tablet Take 1 tablet (40 mg total) by mouth daily. 90 tablet 3  . KRILL OIL PO Take 1 tablet by mouth daily.     . Red Yeast Rice 600 MG TABS Take 600 mg by mouth daily. Daily     No current facility-administered medications for this visit.     Review of Systems : See HPI for pertinent positives and negatives.  Physical Examination  Vitals:   05/04/17 1144  BP: 111/74  Pulse: 63  Resp: 16  Temp: (!) 97.4 F (36.3 C)  TempSrc: Oral  SpO2: 95%  Weight: 215 lb 9.6 oz (97.8 kg)  Height: 6\' 2"  (1.88 m)   Body mass index is 27.68 kg/m.  General: WDWN male in NAD GAIT: normal HENT: No gross abnormalities  Eyes: PERRLA Pulmonary:  Respirations are non-labored, good air movement in all fields, CTAB, no rales, rhonchi, or wheezing. Cardiac: regular rhythm, no detected murmur.  VASCULAR EXAM Carotid Bruits Right Left   Negative Negative    Abdominal aortic pulse is not palpable. Radial pulses are 2+ palpable and equal.                                                                                                                                          LE Pulses Right Left       POPLITEAL  not palpable   not palpable       POSTERIOR TIBIAL   palpable    palpable         DORSALIS PEDIS      ANTERIOR TIBIAL  palpable   palpable     Gastrointestinal: soft, nontender, BS WNL, no r/g, no palpable masses. Musculoskeletal: No muscle atrophy/wasting. M/S 5/5 throughout, extremities without ischemic changes. Skin: No rashes, no ulcers, no cellulitis.   Neurologic:  A&O X 3; appropriate affect, sensation is normal; speech is normal, CN 2-12 intact, pain and light touch intact in extremities, motor exam as listed above. Psychiatric: Normal thought content, mood appropriate to clinical situation.    Assessment: Eric Hahn is a 77 y.o. male whowho is s/p right carotid endarterectomy in August 2016. He has no history of stroke or TIA.   His atherosclerotic risk factors include dyslipidemia, statin intolerance, and former smoker (quit in 1977). Fortunately he does not have DM and stays physically active.   DATA  Carotid Duplex (05/04/17): Right ICA: CEA site, no restenosis Left ICA: 1-39% stenosis Bilateral vertebral artery flow is antegrade.  Bilateral subclavian artery waveforms are normal.  No change compared to the exam on 04-06-16.    Plan: Follow-up in 1 year with Carotid Duplex scan.    I discussed in depth with the patient the nature of atherosclerosis, and emphasized the importance of maximal medical management including strict control of blood pressure, blood glucose, and lipid levels, obtaining regular exercise, and continued cessation of smoking.  The patient is aware that without maximal medical management the underlying atherosclerotic disease process will progress, limiting the benefit of any interventions. The patient was given information about stroke prevention and what symptoms should prompt the patient to seek immediate medical care. Thank you for allowing [place] to participate in this patient's care.  Charisse March, RN, MSN, FNP-C [facility] Office: [phone]  Clinic Physician:  Randie Heinz  05/04/17 12:29 PM

## 2017-05-09 IMAGING — CT CT NECK W/ CM
4 of 5 series · 14 of 33 positions shown, 16 images · IV contrast (iopamidol)
Comparison: None.

CLINICAL DATA: LEFT-sided neck mass on physical exam. Symptoms for
3 days. Pain LEFT neck mass. Palpable area of marked with cutaneous
marker.

EXAM:
CT NECK WITH CONTRAST
TECHNIQUE: Multidetector CT imaging of the neck was performed using the
standard protocol following the bolus administration of intravenous
contrast.
CONTRAST:  75mL SRTIOG-RYY IOPAMIDOL (SRTIOG-RYY) INJECTION 61%

[Series 3: neck 3.0 b31s · axial · 0.45mm/px · z∈[-226,-46]mm · 4 of 102 slices shown, 5 images]
[im 21/102  soft-tissue]
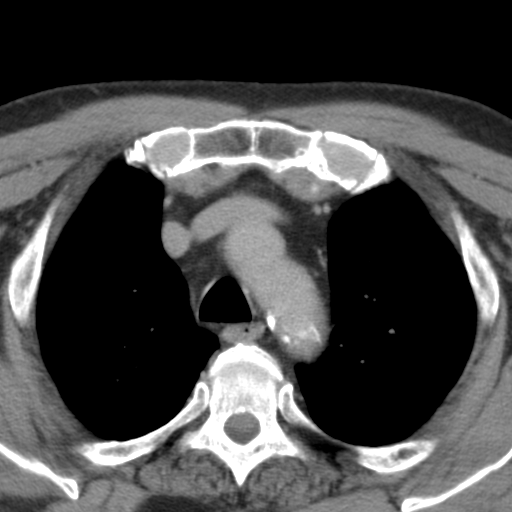
[im 21/102  bone]
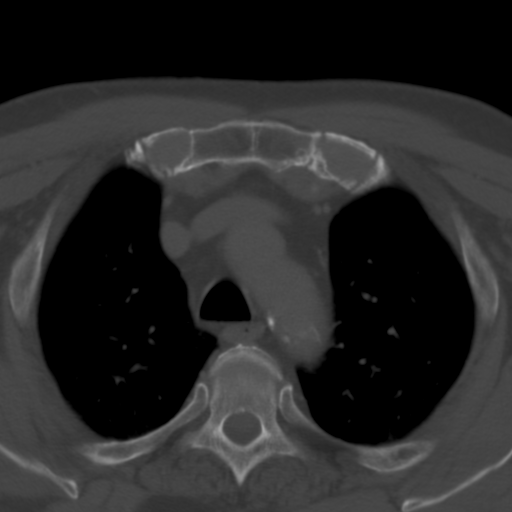
[im 41/102  bone]
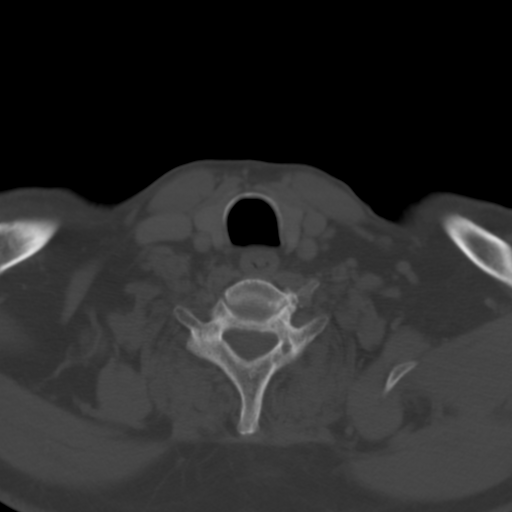
[im 61/102  bone]
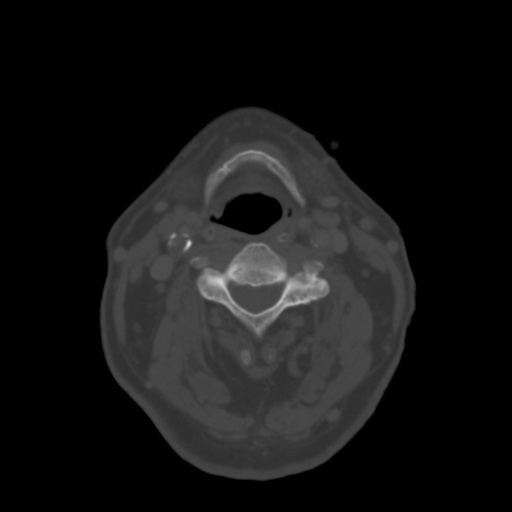
[im 81/102  bone]
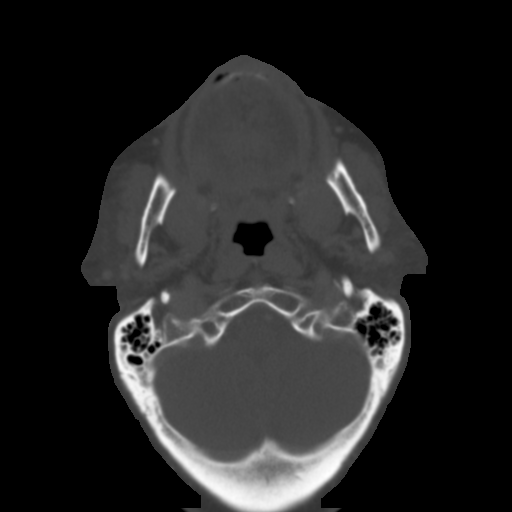

[Series 5: neck 3.0 spo · coronal · 0.46mm/px · 3 of 57 slices shown (1 of 2)]
[im 12/57  bone]
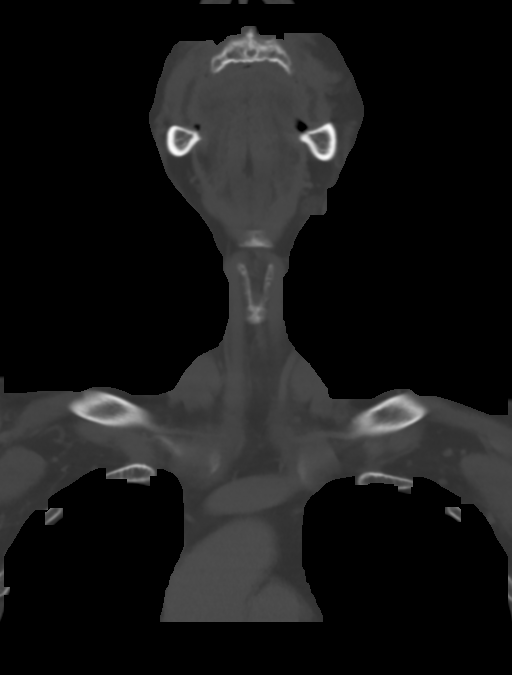
[im 23/57  bone]
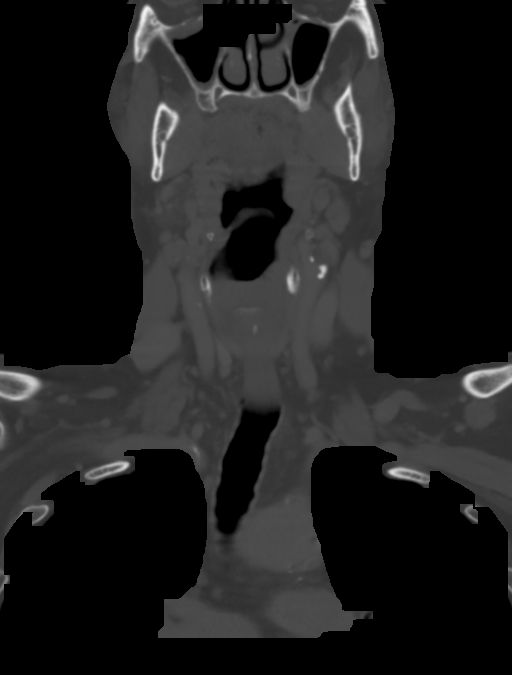
[im 34/57  bone]
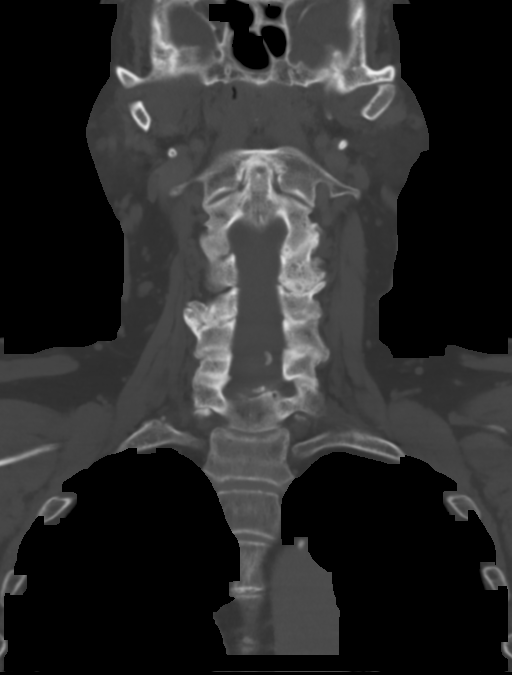

[Series 6: neck 3.0 spo sag · sagittal · 0.51mm/px · 5 of 63 slices shown, 6 images]
[im 21/63  bone]
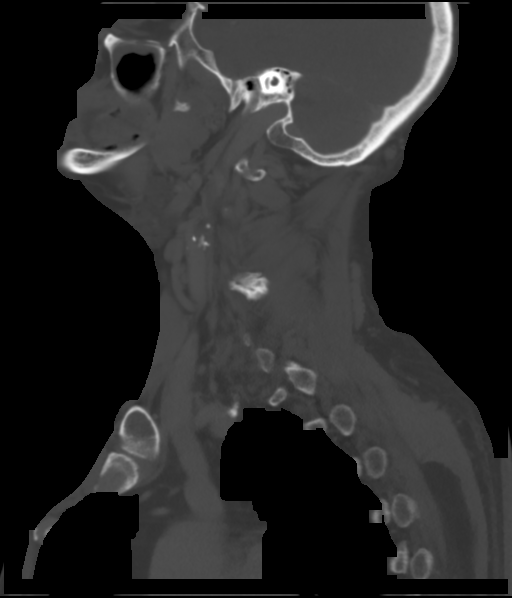
[im 26/63  bone]
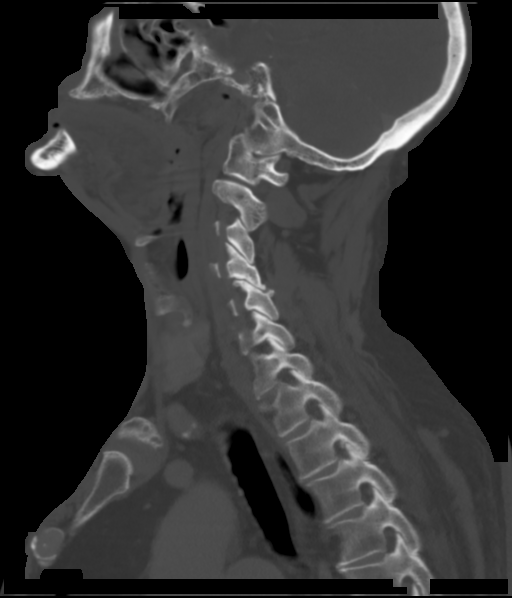
[im 32/63  soft-tissue]
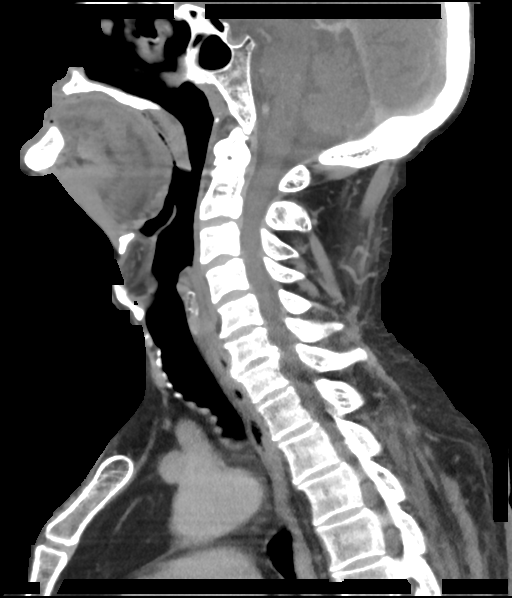
[im 32/63  bone]
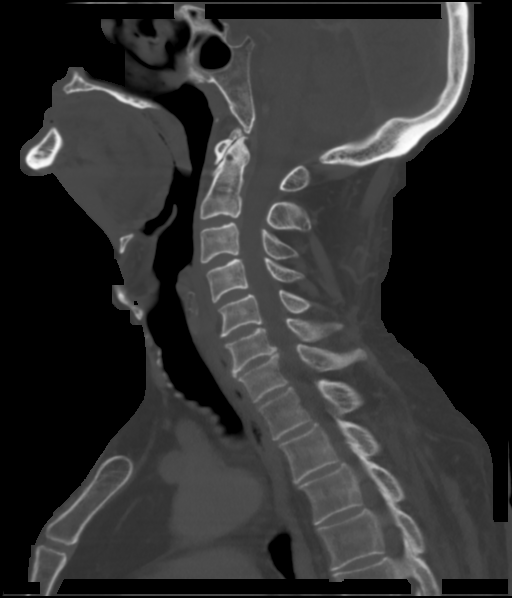
[im 37/63  bone]
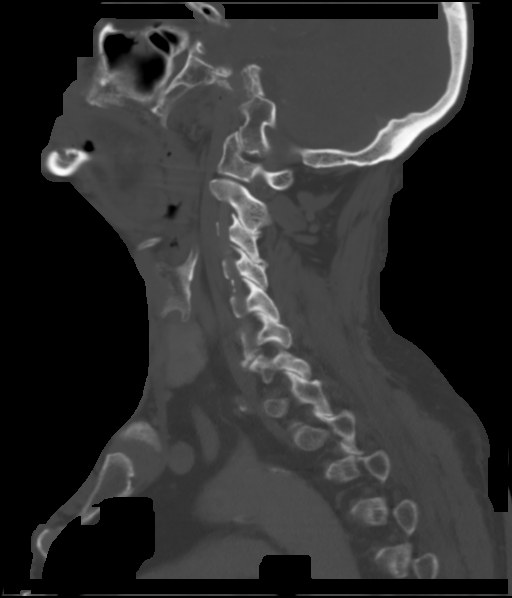
[im 42/63  bone]
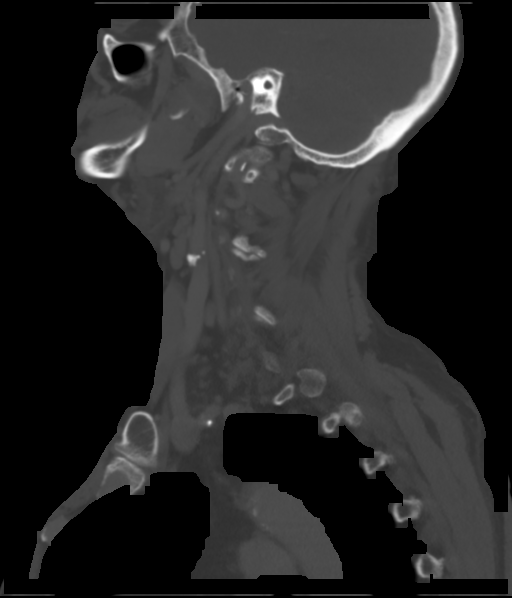

[Series 7: neck 3.0 spo · axial · 0.38mm/px · z∈[-253,-195]mm · 2 of 103 slices shown (2 of 2)]
[im 21/103  bone]
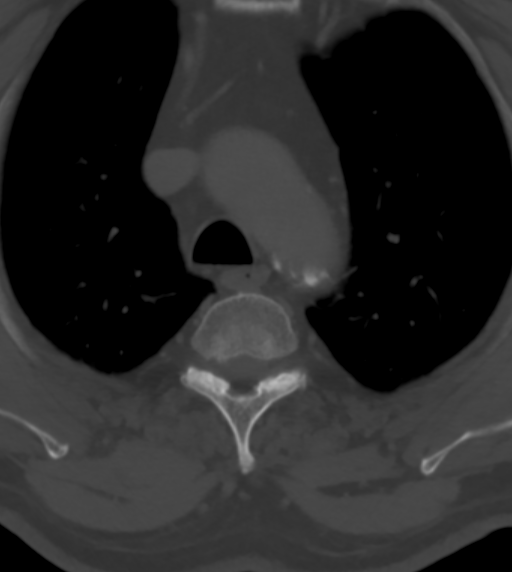
[im 41/103  bone]
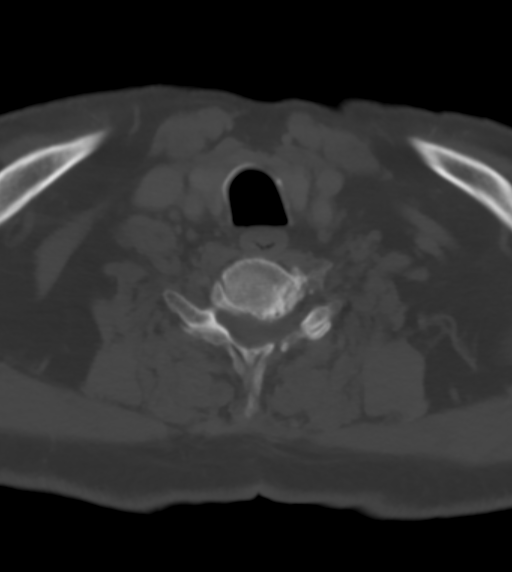

[14 of 33 positions shown; findings below may reference images not displayed]

FINDINGS: Pharynx and larynx: The patient is edentulous. Floor of the mouth
appears normal and symmetric. No retropharyngeal abscess.

Salivary glands: The parotid glands appear normal bilaterally. No
calculi. Both submandibular glands appear normal. The palpable
abnormality corresponds to the LEFT submandibular gland. The gland
is minimally more prominent than the contralateral side, probably
representing ptosis.

Thyroid: Normal.

Lymph nodes: There is no cervical adenopathy.

Vascular: Aortic and branch vessel atherosclerosis. Bilateral
carotid bifurcation atherosclerosis. Intracranial atherosclerosis.
No acute vascular abnormality. There is an apparent 50% stenosis of
the proximal RIGHT internal carotid artery and this stenosis may be
greater than 50% (image 3). Follow-up carotid ultrasound is
recommended as the first step in assessment.

Limited intracranial: Grossly normal.

Visualized orbits: Normal.

Mastoids and visualized paranasal sinuses: Mild RIGHT maxillary
mucosal thickening. Ethmoid air cells appear within normal limits.

Skeleton: Age expected cervical spine degenerative disc and facet
disease. No destructive osseous lesions. Skullbase appears within
normal limits.

Upper chest: Calcified LEFT hilar lymph nodes. Dependent atelectasis
in the lungs. Central airways appear patent.
IMPRESSION: 1. No mass lesion in the area of palpable abnormality. This area
corresponds with the LEFT submandibular gland.
2. Apparently hemodynamically significant stenosis of the RIGHT
proximal internal carotid artery. Carotid ultrasound recommended as
the first step in further evaluation.
3. Mild RIGHT maxillary sinus disease.

## 2017-07-17 ENCOUNTER — Ambulatory Visit (INDEPENDENT_AMBULATORY_CARE_PROVIDER_SITE_OTHER): Payer: Medicare Other | Admitting: Emergency Medicine

## 2017-07-17 ENCOUNTER — Other Ambulatory Visit: Payer: Self-pay

## 2017-07-17 ENCOUNTER — Encounter: Payer: Self-pay | Admitting: Emergency Medicine

## 2017-07-17 VITALS — BP 136/88 | HR 72 | Temp 97.7°F | Resp 16 | Ht 70.0 in | Wt 210.4 lb

## 2017-07-17 DIAGNOSIS — I10 Essential (primary) hypertension: Secondary | ICD-10-CM | POA: Diagnosis not present

## 2017-07-17 DIAGNOSIS — Z0001 Encounter for general adult medical examination with abnormal findings: Secondary | ICD-10-CM

## 2017-07-17 MED ORDER — FOSINOPRIL SODIUM 40 MG PO TABS: 40.0000 mg | ORAL_TABLET | Freq: Every day | ORAL | 3 refills | Status: DC

## 2017-07-17 NOTE — Patient Instructions (Addendum)
   IF you received an x-ray today, you will receive an invoice from [facility]. Please contact [facility] at [phone] with questions or concerns regarding your invoice.   IF you received labwork today, you will receive an invoice from [facility]. Please contact [facility] at [phone] with questions or concerns regarding your invoice.   Our billing staff will not be able to assist you with questions regarding bills from these companies.  You will be contacted with the lab results as soon as they are available. The fastest way to get your results is to activate your My Chart account. Instructions are located on the last page of this paperwork. If you have not heard from us regarding the results in 2 weeks, please contact this office.      Health Maintenance, Male A healthy lifestyle and preventive care is important for your health and wellness. Ask your health care provider about what schedule of regular examinations is right for you. What should I know about weight and diet? Eat a Healthy Diet  Eat plenty of vegetables, fruits, whole grains, low-fat dairy products, and lean protein.  Do not eat a lot of foods high in solid fats, added sugars, or salt.  Maintain a Healthy Weight Regular exercise can help you achieve or maintain a healthy weight. You should:  Do at least 150 minutes of exercise each week. The exercise should increase your heart rate and make you sweat (moderate-intensity exercise).  Do strength-training exercises at least twice a week.  Watch Your Levels of Cholesterol and Blood Lipids  Have your blood tested for lipids and cholesterol every 5 years starting at 76 years of age. If you are at high risk for heart disease, you should start having your blood tested when you are 76 years old. You may need to have your cholesterol levels checked more often if: ? Your lipid or cholesterol levels are high. ? You are older than 76 years of age. ? You  are at high risk for heart disease.  What should I know about cancer screening? Many types of cancers can be detected early and may often be prevented. Lung Cancer  You should be screened every year for lung cancer if: ? You are a current smoker who has smoked for at least 30 years. ? You are a former smoker who has quit within the past 15 years.  Talk to your health care provider about your screening options, when you should start screening, and how often you should be screened.  Colorectal Cancer  Routine colorectal cancer screening usually begins at 76 years of age and should be repeated every 5-10 years until you are 75 years old. You may need to be screened more often if early forms of precancerous polyps or small growths are found. Your health care provider may recommend screening at an earlier age if you have risk factors for colon cancer.  Your health care provider may recommend using home test kits to check for hidden blood in the stool.  A small camera at the end of a tube can be used to examine your colon (sigmoidoscopy or colonoscopy). This checks for the earliest forms of colorectal cancer.  Prostate and Testicular Cancer  Depending on your age and overall health, your health care provider may do certain tests to screen for prostate and testicular cancer.  Talk to your health care provider about any symptoms or concerns you have about testicular or prostate cancer.  Skin Cancer  Check your skin   from head to toe regularly.  Tell your health care provider about any new moles or changes in moles, especially if: ? There is a change in a mole's size, shape, or color. ? You have a mole that is larger than a pencil eraser.  Always use sunscreen. Apply sunscreen liberally and repeat throughout the day.  Protect yourself by wearing long sleeves, pants, a wide-brimmed hat, and sunglasses when outside.  What should I know about heart disease, diabetes, and high blood  pressure?  If you are 18-39 years of age, have your blood pressure checked every 3-5 years. If you are 40 years of age or older, have your blood pressure checked every year. You should have your blood pressure measured twice-once when you are at a hospital or clinic, and once when you are not at a hospital or clinic. Record the average of the two measurements. To check your blood pressure when you are not at a hospital or clinic, you can use: ? An automated blood pressure machine at a pharmacy. ? A home blood pressure monitor.  Talk to your health care provider about your target blood pressure.  If you are between 45-79 years old, ask your health care provider if you should take aspirin to prevent heart disease.  Have regular diabetes screenings by checking your fasting blood sugar level. ? If you are at a normal weight and have a low risk for diabetes, have this test once every three years after the age of 45. ? If you are overweight and have a high risk for diabetes, consider being tested at a younger age or more often.  A one-time screening for abdominal aortic aneurysm (AAA) by ultrasound is recommended for men aged 65-75 years who are current or former smokers. What should I know about preventing infection? Hepatitis B If you have a higher risk for hepatitis B, you should be screened for this virus. Talk with your health care provider to find out if you are at risk for hepatitis B infection. Hepatitis C Blood testing is recommended for:  Everyone born from 1945 through 1965.  Anyone with known risk factors for hepatitis C.  Sexually Transmitted Diseases (STDs)  You should be screened each year for STDs including gonorrhea and chlamydia if: ? You are sexually active and are younger than 76 years of age. ? You are older than 76 years of age and your health care provider tells you that you are at risk for this type of infection. ? Your sexual activity has changed since you were last  screened and you are at an increased risk for chlamydia or gonorrhea. Ask your health care provider if you are at risk.  Talk with your health care provider about whether you are at high risk of being infected with HIV. Your health care provider may recommend a prescription medicine to help prevent HIV infection.  What else can I do?  Schedule regular health, dental, and eye exams.  Stay current with your vaccines (immunizations).  Do not use any tobacco products, such as cigarettes, chewing tobacco, and e-cigarettes. If you need help quitting, ask your health care provider.  Limit alcohol intake to no more than 2 drinks per day. One drink equals 12 ounces of beer, 5 ounces of wine, or 1 ounces of hard liquor.  Do not use street drugs.  Do not share needles.  Ask your health care provider for help if you need support or information about quitting drugs.  Tell your health care   provider if you often feel depressed.  Tell your health care provider if you have ever been abused or do not feel safe at home. This information is not intended to replace advice given to you by your health care provider. Make sure you discuss any questions you have with your health care provider. Document Released: 07/29/2007 Document Revised: 09/29/2015 Document Reviewed: 11/03/2014 Elsevier Interactive Patient Education  2018 Elsevier Inc.  

## 2017-07-17 NOTE — Progress Notes (Signed)
Kayston B Allnutt 77 y.o.   Chief Complaint  Patient presents with  . Annual Exam  . Establish Care    HISTORY OF PRESENT ILLNESS: This is a 77 y.o. male Here for annual exam; no complaints and no medical concerns. This morning developed some swelling to right lower lip, unknown source, slowly getting better now. Has history of hypertension, and has been on Monopril for many years.  HPI   Prior to Admission medications   Medication Sig Start Date End Date Taking? Authorizing Provider  aspirin 81 MG tablet Take 81 mg by mouth daily.     Yes [provider]  Flaxseed, Linseed, (FLAX SEED OIL) 1000 MG CAPS Take 1 capsule by mouth daily. daily   Yes [provider]  fosinopril (MONOPRIL) 40 MG tablet Take 1 tablet (40 mg total) by mouth daily. 07/28/16  Yes Dajanae Brophy, Eilleen Kempf, MD  KRILL OIL PO Take 1 tablet by mouth daily.    Yes [provider]  Red Yeast Rice 600 MG TABS Take 600 mg by mouth daily. Daily   Yes [provider]    Allergies  Allergen Reactions  . Amoxicillin Hives and Rash  . Sulfa Antibiotics Other (See Comments)    Unknown  . Statins Other (See Comments)    Pt is intolerant-muscle aches and weakness    Patient Active Problem List   Diagnosis Date Noted  . Routine general medical examination at a health care facility 07/28/2016  . Thyroid nodule 10/21/2014  . Asymptomatic stenosis of right carotid artery 09/15/2014  . Other and unspecified hyperlipidemia 06/10/2012  . Abnormal ECG 06/06/2010  . Hypertension 06/06/2010    Past Medical History:  Diagnosis Date  . Arthritis   . Carotid artery occlusion   . Full dentures   . History of asthma    pt. states as a child  . History of bronchitis   . History of kidney stones   . History of pneumonia   . HOH (hard of hearing)   . Hyperlipidemia   . Hypertension   . Obesity   . PONV (postoperative nausea and vomiting)    pt. states that was sick with first surgery but  2nd surgery used patch behind ears and had no issues    Past Surgical History:  Procedure Laterality Date  . CAROTID ENDARTERECTOMY Right 09-15-14  . ENDARTERECTOMY Right 09/15/2014   Procedure: RIGHT CAROTID ENDARTERECTOMY WITH HEMASHIELD PATCH ANGIOPLASTY ;  Surgeon: Sherren Kerns, MD;  Location: [facility] OR;  Service: Vascular;  Laterality: Right;  . NASAL SINUS SURGERY    . SPINE SURGERY     C-7 disk surgery  by Dr. Darreld Mclean     Social History   Socioeconomic History  . Marital status: Married    Spouse name: Not on file  . Number of children: Not on file  . Years of education: Not on file  . Highest education level: Not on file  Occupational History  . Not on file  Social Needs  . Financial resource strain: Not on file  . Food insecurity:    Worry: Not on file    Inability: Not on file  . Transportation needs:    Medical: Not on file    Non-medical: Not on file  Tobacco Use  . Smoking status: Former Smoker    Types: Cigarettes    Last attempt to quit: 06/06/1975    Years since quitting: 42.1  . Smokeless tobacco: Never Used  Substance and Sexual  Activity  . Alcohol use: No  . Drug use: No  . Sexual activity: Not on file  Lifestyle  . Physical activity:    Days per week: Not on file    Minutes per session: Not on file  . Stress: Not on file  Relationships  . Social connections:    Talks on phone: Not on file    Gets together: Not on file    Attends religious service: Not on file    Active member of club or organization: Not on file    Attends meetings of clubs or organizations: Not on file    Relationship status: Not on file  . Intimate partner violence:    Fear of current or ex partner: Not on file    Emotionally abused: Not on file    Physically abused: Not on file    Forced sexual activity: Not on file  Other Topics Concern  . Not on file  Social History Narrative   Married   Education: High School   Exercise: Yes    Family History  Problem  Relation Age of Onset  . Hyperlipidemia Father   . Arthritis Sister      Review of Systems  Constitutional: Negative.  Negative for chills, fever and malaise/fatigue.  HENT: Negative.  Negative for congestion, nosebleeds and sore throat.   Eyes: Negative.  Negative for blurred vision and double vision.  Respiratory: Negative.  Negative for cough and shortness of breath.   Cardiovascular: Negative.  Negative for chest pain and palpitations.  Gastrointestinal: Negative.  Negative for abdominal pain, nausea and vomiting.  Genitourinary: Negative.  Negative for dysuria, frequency and hematuria.  Musculoskeletal: Negative.  Negative for back pain, joint pain, myalgias and neck pain.  Skin: Negative.  Negative for rash.  Neurological: Negative.  Negative for dizziness and headaches.  Endo/Heme/Allergies: Negative.   All other systems reviewed and are negative.  Vitals:   07/17/17 0806  BP: 136/88  Pulse: 72  Resp: 16  Temp: 97.7 F (36.5 C)  SpO2: 95%     Physical Exam  Constitutional: He is oriented to person, place, and time. He appears well-developed and well-nourished.  HENT:  Head: Normocephalic and atraumatic.  Right Ear: External ear normal.  Left Ear: External ear normal.  Nose: Nose normal.  Mouth/Throat: Oropharynx is clear and moist.  Slightly swollen right lower lip.  Eyes: Pupils are equal, round, and reactive to light. Conjunctivae and EOM are normal.  Neck: Normal range of motion. Neck supple. No JVD present.  Right-sided old surgical scar from endarterectomy  Cardiovascular: Normal rate, regular rhythm and normal heart sounds.  No murmur heard. Pulmonary/Chest: Effort normal and breath sounds normal.  Abdominal: Soft. Bowel sounds are normal. He exhibits no distension and no mass. There is no tenderness. There is no rebound.  Musculoskeletal: Normal range of motion. He exhibits no edema.  Lymphadenopathy:    He has no cervical adenopathy.  Neurological: He  is alert and oriented to person, place, and time. No sensory deficit. He exhibits normal muscle tone.  Skin: Skin is warm and dry. Capillary refill takes less than 2 seconds. No rash noted.  Psychiatric: He has a normal mood and affect. His behavior is normal.  Vitals reviewed.    ASSESSMENT & PLAN: Shiheem was seen today for annual exam and establish care.  Diagnoses and all orders for this visit:  Encounter for general adult medical examination with abnormal findings -     CBC with Differential -  Comprehensive metabolic panel -     Hemoglobin A1c -     Lipid panel -     PSA(Must document that pt has been informed of limitations of PSA testing.)  Essential hypertension -     fosinopril (MONOPRIL) 40 MG tablet; Take 1 tablet (40 mg total) by mouth daily.    Patient Instructions       IF you received an x-ray today, you will receive an invoice from [facility]. Please contact [facility] at [phone] with questions or concerns regarding your invoice.   IF you received labwork today, you will receive an invoice from [facility]. Please contact [facility] at [phone] with questions or concerns regarding your invoice.   Our billing staff will not be able to assist you with questions regarding bills from these companies.  You will be contacted with the lab results as soon as they are available. The fastest way to get your results is to activate your My Chart account. Instructions are located on the last page of this paperwork. If you have not heard from [place] regarding the results in 2 weeks, please contact this office.      Health Maintenance, Male A healthy lifestyle and preventive care is important for your health and wellness. Ask your health care provider about what schedule of regular examinations is right for you. What should I know about weight and diet? Eat a Healthy Diet  Eat plenty of vegetables, fruits, whole grains, low-fat dairy products, and  lean protein.  Do not eat a lot of foods high in solid fats, added sugars, or salt.  Maintain a Healthy Weight Regular exercise can help you achieve or maintain a healthy weight. You should:  Do at least 150 minutes of exercise each week. The exercise should increase your heart rate and make you sweat (moderate-intensity exercise).  Do strength-training exercises at least twice a week.  Watch Your Levels of Cholesterol and Blood Lipids  Have your blood tested for lipids and cholesterol every 5 years starting at 76 years of age. If you are at high risk for heart disease, you should start having your blood tested when you are 76 years old. You may need to have your cholesterol levels checked more often if: ? Your lipid or cholesterol levels are high. ? You are older than 76 years of age. ? You are at high risk for heart disease.  What should I know about cancer screening? Many types of cancers can be detected early and may often be prevented. Lung Cancer  You should be screened every year for lung cancer if: ? You are a current smoker who has smoked for at least 30 years. ? You are a former smoker who has quit within the past 15 years.  Talk to your health care provider about your screening options, when you should start screening, and how often you should be screened.  Colorectal Cancer  Routine colorectal cancer screening usually begins at 76 years of age and should be repeated every 5-10 years until you are 76 years old. You may need to be screened more often if early forms of precancerous polyps or small growths are found. Your health care provider may recommend screening at an earlier age if you have risk factors for colon cancer.  Your health care provider may recommend using home test kits to check for hidden blood in the stool.  A small camera at the end of a tube can be used to examine your colon (sigmoidoscopy or  colonoscopy). This checks for the earliest forms of colorectal  cancer.  Prostate and Testicular Cancer  Depending on your age and overall health, your health care provider may do certain tests to screen for prostate and testicular cancer.  Talk to your health care provider about any symptoms or concerns you have about testicular or prostate cancer.  Skin Cancer  Check your skin from head to toe regularly.  Tell your health care provider about any new moles or changes in moles, especially if: ? There is a change in a mole's size, shape, or color. ? You have a mole that is larger than a pencil eraser.  Always use sunscreen. Apply sunscreen liberally and repeat throughout the day.  Protect yourself by wearing long sleeves, pants, a wide-brimmed hat, and sunglasses when outside.  What should I know about heart disease, diabetes, and high blood pressure?  If you are 18-51 years of age, have your blood pressure checked every 3-5 years. If you are 54 years of age or older, have your blood pressure checked every year. You should have your blood pressure measured twice-once when you are at a hospital or clinic, and once when you are not at a hospital or clinic. Record the average of the two measurements. To check your blood pressure when you are not at a hospital or clinic, you can use: ? An automated blood pressure machine at a pharmacy. ? A home blood pressure monitor.  Talk to your health care provider about your target blood pressure.  If you are between 3-41 years old, ask your health care provider if you should take aspirin to prevent heart disease.  Have regular diabetes screenings by checking your fasting blood sugar level. ? If you are at a normal weight and have a low risk for diabetes, have this test once every three years after the age of 62. ? If you are overweight and have a high risk for diabetes, consider being tested at a younger age or more often.  A one-time screening for abdominal aortic aneurysm (AAA) by ultrasound is recommended  for men aged 65-75 years who are current or former smokers. What should I know about preventing infection? Hepatitis B If you have a higher risk for hepatitis B, you should be screened for this virus. Talk with your health care provider to find out if you are at risk for hepatitis B infection. Hepatitis C Blood testing is recommended for:  Everyone born from 18 through 1965.  Anyone with known risk factors for hepatitis C.  Sexually Transmitted Diseases (STDs)  You should be screened each year for STDs including gonorrhea and chlamydia if: ? You are sexually active and are younger than 76 years of age. ? You are older than 76 years of age and your health care provider tells you that you are at risk for this type of infection. ? Your sexual activity has changed since you were last screened and you are at an increased risk for chlamydia or gonorrhea. Ask your health care provider if you are at risk.  Talk with your health care provider about whether you are at high risk of being infected with HIV. Your health care provider may recommend a prescription medicine to help prevent HIV infection.  What else can I do?  Schedule regular health, dental, and eye exams.  Stay current with your vaccines (immunizations).  Do not use any tobacco products, such as cigarettes, chewing tobacco, and e-cigarettes. If you need help quitting, ask your health  care provider.  Limit alcohol intake to no more than 2 drinks per day. One drink equals 12 ounces of beer, 5 ounces of wine, or 1 ounces of hard liquor.  Do not use street drugs.  Do not share needles.  Ask your health care provider for help if you need support or information about quitting drugs.  Tell your health care provider if you often feel depressed.  Tell your health care provider if you have ever been abused or do not feel safe at home. This information is not intended to replace advice given to you by your health care provider. Make  sure you discuss any questions you have with your health care provider. Document Released: 07/29/2007 Document Revised: 09/29/2015 Document Reviewed: 11/03/2014 Elsevier Interactive Patient Education  2018 [employer]vier Inc.      Edwina BarthMiguel Svea Pusch, MD [facility]

## 2017-07-18 ENCOUNTER — Other Ambulatory Visit: Payer: Self-pay | Admitting: Emergency Medicine

## 2017-07-18 ENCOUNTER — Encounter: Payer: Self-pay | Admitting: *Deleted

## 2017-07-18 DIAGNOSIS — E785 Hyperlipidemia, unspecified: Secondary | ICD-10-CM

## 2017-07-18 LAB — CBC WITH DIFFERENTIAL/PLATELET
BASOS ABS: 0 10*3/uL (ref 0.0–0.2)
Basos: 0 %
EOS (ABSOLUTE): 0.3 10*3/uL (ref 0.0–0.4)
Eos: 6 %
Hematocrit: 42.4 % (ref 37.5–51.0)
Hemoglobin: 14.7 g/dL (ref 13.0–17.7)
Immature Grans (Abs): 0 10*3/uL (ref 0.0–0.1)
Immature Granulocytes: 0 %
LYMPHS ABS: 1.5 10*3/uL (ref 0.7–3.1)
Lymphs: 31 %
MCH: 30.5 pg (ref 26.6–33.0)
MCHC: 34.7 g/dL (ref 31.5–35.7)
MCV: 88 fL (ref 79–97)
MONOS ABS: 0.3 10*3/uL (ref 0.1–0.9)
Monocytes: 7 %
Neutrophils Absolute: 2.7 10*3/uL (ref 1.4–7.0)
Neutrophils: 56 %
PLATELETS: 248 10*3/uL (ref 150–450)
RBC: 4.82 x10E6/uL (ref 4.14–5.80)
RDW: 13.7 % (ref 12.3–15.4)
WBC: 4.8 10*3/uL (ref 3.4–10.8)

## 2017-07-18 LAB — COMPREHENSIVE METABOLIC PANEL
ALK PHOS: 67 IU/L (ref 39–117)
ALT: 20 IU/L (ref 0–44)
AST: 19 IU/L (ref 0–40)
Albumin/Globulin Ratio: 1.8 (ref 1.2–2.2)
Albumin: 4.7 g/dL (ref 3.5–4.8)
BILIRUBIN TOTAL: 0.6 mg/dL (ref 0.0–1.2)
BUN/Creatinine Ratio: 14 (ref 10–24)
BUN: 13 mg/dL (ref 8–27)
CHLORIDE: 104 mmol/L (ref 96–106)
CO2: 20 mmol/L (ref 20–29)
CREATININE: 0.96 mg/dL (ref 0.76–1.27)
Calcium: 9.8 mg/dL (ref 8.6–10.2)
GFR calc Af Amer: 89 mL/min/{1.73_m2} (ref >59)
GFR calc non Af Amer: 77 mL/min/{1.73_m2} (ref >59)
GLUCOSE: 106 mg/dL — AB (ref 65–99)
Globulin, Total: 2.6 g/dL (ref 1.5–4.5)
Potassium: 4.2 mmol/L (ref 3.5–5.2)
Sodium: 141 mmol/L (ref 134–144)
Total Protein: 7.3 g/dL (ref 6.0–8.5)

## 2017-07-18 LAB — LIPID PANEL
CHOL/HDL RATIO: 6.3 ratio — AB (ref 0.0–5.0)
Cholesterol, Total: 264 mg/dL — ABNORMAL HIGH (ref 100–199)
HDL: 42 mg/dL (ref >39)
LDL Calculated: 184 mg/dL — ABNORMAL HIGH (ref 0–99)
TRIGLYCERIDES: 192 mg/dL — AB (ref 0–149)
VLDL Cholesterol Cal: 38 mg/dL (ref 5–40)

## 2017-07-18 LAB — PSA: PROSTATE SPECIFIC AG, SERUM: 2.5 ng/mL (ref 0.0–4.0)

## 2017-07-18 LAB — HEMOGLOBIN A1C
ESTIMATED AVERAGE GLUCOSE: 126 mg/dL
Hgb A1c MFr Bld: 6 % — ABNORMAL HIGH (ref 4.8–5.6)

## 2017-07-18 MED ORDER — FENOFIBRATE 145 MG PO TABS: 145.0000 mg | ORAL_TABLET | Freq: Every day | ORAL | 1 refills | Status: DC

## 2018-05-31 ENCOUNTER — Ambulatory Visit: Payer: Medicare Other | Admitting: Family

## 2018-05-31 ENCOUNTER — Encounter (HOSPITAL_COMMUNITY): Payer: Medicare Other

## 2018-07-23 ENCOUNTER — Other Ambulatory Visit: Payer: Self-pay | Admitting: Emergency Medicine

## 2018-07-23 ENCOUNTER — Other Ambulatory Visit: Payer: Self-pay | Admitting: *Deleted

## 2018-07-23 DIAGNOSIS — I10 Essential (primary) hypertension: Secondary | ICD-10-CM

## 2018-07-23 NOTE — Telephone Encounter (Signed)
Please advise 

## 2018-07-31 ENCOUNTER — Telehealth: Payer: Self-pay | Admitting: *Deleted

## 2018-07-31 NOTE — Telephone Encounter (Signed)
Pt called back and confirm that he will be at the office at 8:40 for both appts

## 2018-07-31 NOTE — Telephone Encounter (Signed)
Please confirm with patient that he can come in at 8;40 for AWV then see doctor at 9;20,

## 2018-08-06 ENCOUNTER — Ambulatory Visit (INDEPENDENT_AMBULATORY_CARE_PROVIDER_SITE_OTHER): Payer: Medicare Other | Admitting: Emergency Medicine

## 2018-08-06 ENCOUNTER — Encounter: Payer: Self-pay | Admitting: Emergency Medicine

## 2018-08-06 ENCOUNTER — Other Ambulatory Visit: Payer: Self-pay

## 2018-08-06 VITALS — BP 142/84 | HR 63 | Temp 97.2°F | Ht 72.0 in | Wt 215.9 lb

## 2018-08-06 VITALS — BP 150/86 | HR 63 | Temp 97.2°F | Resp 16 | Ht 72.0 in | Wt 215.4 lb

## 2018-08-06 DIAGNOSIS — Z0000 Encounter for general adult medical examination without abnormal findings: Secondary | ICD-10-CM

## 2018-08-06 DIAGNOSIS — I10 Essential (primary) hypertension: Secondary | ICD-10-CM

## 2018-08-06 LAB — LIPID PANEL

## 2018-08-06 MED ORDER — FOSINOPRIL SODIUM 40 MG PO TABS: 40.0000 mg | ORAL_TABLET | Freq: Every day | ORAL | 3 refills | Status: DC

## 2018-08-06 NOTE — Patient Instructions (Addendum)
   If you have lab work done today you will be contacted with your lab results within the next 2 weeks.  If you have not heard from us then please contact us. The fastest way to get your results is to register for My Chart.   IF you received an x-ray today, you will receive an invoice from [facility]. Please contact [facility] at [phone] with questions or concerns regarding your invoice.   IF you received labwork today, you will receive an invoice from [facility]. Please contact [facility] at [phone] with questions or concerns regarding your invoice.   Our billing staff will not be able to assist you with questions regarding bills from these companies.  You will be contacted with the lab results as soon as they are available. The fastest way to get your results is to activate your My Chart account. Instructions are located on the last page of this paperwork. If you have not heard from us regarding the results in 2 weeks, please contact this office.     Health Maintenance After Age 65 After age 65, you are at a higher risk for certain long-term diseases and infections as well as injuries from falls. Falls are a major cause of broken bones and head injuries in people who are older than age 65. Getting regular preventive care can help to keep you healthy and well. Preventive care includes getting regular testing and making lifestyle changes as recommended by your health care provider. Talk with your health care provider about:  Which screenings and tests you should have. A screening is a test that checks for a disease when you have no symptoms.  A diet and exercise plan that is right for you. What should I know about screenings and tests to prevent falls? Screening and testing are the best ways to find a health problem early. Early diagnosis and treatment give you the best chance of managing medical conditions that are common after age 65. Certain conditions and  lifestyle choices may make you more likely to have a fall. Your health care provider may recommend:  Regular vision checks. Poor vision and conditions such as cataracts can make you more likely to have a fall. If you wear glasses, make sure to get your prescription updated if your vision changes.  Medicine review. Work with your health care provider to regularly review all of the medicines you are taking, including over-the-counter medicines. Ask your health care provider about any side effects that may make you more likely to have a fall. Tell your health care provider if any medicines that you take make you feel dizzy or sleepy.  Osteoporosis screening. Osteoporosis is a condition that causes the bones to get weaker. This can make the bones weak and cause them to break more easily.  Blood pressure screening. Blood pressure changes and medicines to control blood pressure can make you feel dizzy.  Strength and balance checks. Your health care provider may recommend certain tests to check your strength and balance while standing, walking, or changing positions.  Foot health exam. Foot pain and numbness, as well as not wearing proper footwear, can make you more likely to have a fall.  Depression screening. You may be more likely to have a fall if you have a fear of falling, feel emotionally low, or feel unable to do activities that you used to do.  Alcohol use screening. Using too much alcohol can affect your balance and may make you more likely to   have a fall. What actions can I take to lower my risk of falls? General instructions  Talk with your health care provider about your risks for falling. Tell your health care provider if: ? You fall. Be sure to tell your health care provider about all falls, even ones that seem minor. ? You feel dizzy, sleepy, or off-balance.  Take over-the-counter and prescription medicines only as told by your health care provider. These include any  supplements.  Eat a healthy diet and maintain a healthy weight. A healthy diet includes low-fat dairy products, low-fat (lean) meats, and fiber from whole grains, beans, and lots of fruits and vegetables. Home safety  Remove any tripping hazards, such as rugs, cords, and clutter.  Install safety equipment such as grab bars in bathrooms and safety rails on stairs.  Keep rooms and walkways well-lit. Activity   Follow a regular exercise program to stay fit. This will help you maintain your balance. Ask your health care provider what types of exercise are appropriate for you.  If you need a cane or walker, use it as recommended by your health care provider.  Wear supportive shoes that have nonskid soles. Lifestyle  Do not drink alcohol if your health care provider tells you not to drink.  If you drink alcohol, limit how much you have: ? 0-1 drink a day for women. ? 0-2 drinks a day for men.  Be aware of how much alcohol is in your drink. In the [place] one drink equals one typical bottle of beer (12 oz), one-half glass of wine (5 oz), or one shot of hard liquor (1 oz).  Do not use any products that contain nicotine or tobacco, such as cigarettes and e-cigarettes. If you need help quitting, ask your health care provider. Summary  Having a healthy lifestyle and getting preventive care can help to protect your health and wellness after age 65.  Screening and testing are the best way to find a health problem early and help you avoid having a fall. Early diagnosis and treatment give you the best chance for managing medical conditions that are more common for people who are older than age 65.  Falls are a major cause of broken bones and head injuries in people who are older than age 65. Take precautions to prevent a fall at home.  Work with your health care provider to learn what changes you can make to improve your health and wellness and to prevent falls. This information is not intended  to replace advice given to you by your health care provider. Make sure you discuss any questions you have with your health care provider. Document Released: 12/13/2016 Document Revised: 12/13/2016 Document Reviewed: 12/13/2016 Elsevier Interactive Patient Education  2019 Elsevier Inc.  

## 2018-08-06 NOTE — Patient Instructions (Signed)
Thank you for taking time to come for your Medicare Wellness Visit. I appreciate your ongoing commitment to your health goals. Please review the following plan we discussed and let me know if I can assist you in the future.  Eric Banghart LPN Preventive Care 65 Years and Older, Male Preventive care refers to lifestyle choices and visits with your health care provider that can promote health and wellness. What does preventive care include?   A yearly physical exam. This is also called an annual well check.  Dental exams once or twice a year.  Routine eye exams. Ask your health care provider how often you should have your eyes checked.  Personal lifestyle choices, including: ? Daily care of your teeth and gums. ? Regular physical activity. ? Eating a healthy diet. ? Avoiding tobacco and drug use. ? Limiting alcohol use. ? Practicing safe sex. ? Taking low doses of aspirin every day. ? Taking vitamin and mineral supplements as recommended by your health care provider. What happens during an annual well check? The services and screenings done by your health care provider during your annual well check will depend on your age, overall health, lifestyle risk factors, and family history of disease. Counseling Your health care provider may ask you questions about your:  Alcohol use.  Tobacco use.  Drug use.  Emotional well-being.  Home and relationship well-being.  Sexual activity.  Eating habits.  History of falls.  Memory and ability to understand (cognition).  Work and work environment. Screening You may have the following tests or measurements:  Height, weight, and BMI.  Blood pressure.  Lipid and cholesterol levels. These may be checked every 5 years, or more frequently if you are over 50 years old.  Skin check.  Lung cancer screening. You may have this screening every year starting at age 55 if you have a 30-pack-year history of smoking and currently smoke or  have quit within the past 15 years.  Colorectal cancer screening. All adults should have this screening starting at age 50 and continuing until age 75. You will have tests every 1-10 years, depending on your results and the type of screening test. People at increased risk should start screening at an earlier age. Screening tests may include: ? Guaiac-based fecal occult blood testing. ? Fecal immunochemical test (FIT). ? Stool DNA test. ? Virtual colonoscopy. ? Sigmoidoscopy. During this test, a flexible tube with a tiny camera (sigmoidoscope) is used to examine your rectum and lower colon. The sigmoidoscope is inserted through your anus into your rectum and lower colon. ? Colonoscopy. During this test, a long, thin, flexible tube with a tiny camera (colonoscope) is used to examine your entire colon and rectum.  Prostate cancer screening. Recommendations will vary depending on your family history and other risks.  Hepatitis C blood test.  Hepatitis B blood test.  Sexually transmitted disease (STD) testing.  Diabetes screening. This is done by checking your blood sugar (glucose) after you have not eaten for a while (fasting). You may have this done every 1-3 years.  Abdominal aortic aneurysm (AAA) screening. You may need this if you are a current or former smoker.  Osteoporosis. You may be screened starting at age 70 if you are at high risk. Talk with your health care provider about your test results, treatment options, and if necessary, the need for more tests. Vaccines Your health care provider may recommend certain vaccines, such as:  Influenza vaccine. This is recommended every year.  Tetanus, diphtheria, and   Tetanus, diphtheria, and acellular pertussis (Tdap, Td) vaccine. You may need a Td booster every 10 years.  Varicella vaccine. You may need this if you have not been vaccinated.  Zoster vaccine. You may need this after age 60.  Measles, mumps, and rubella (MMR) vaccine. You may need at least one  dose of MMR if you were born in 1957 or later. You may also need a second dose.  Pneumococcal 13-valent conjugate (PCV13) vaccine. One dose is recommended after age 77.  Pneumococcal polysaccharide (PPSV23) vaccine. One dose is recommended after age 77.  Meningococcal vaccine. You may need this if you have certain conditions.  Hepatitis A vaccine. You may need this if you have certain conditions or if you travel or work in places where you may be exposed to hepatitis A.  Hepatitis B vaccine. You may need this if you have certain conditions or if you travel or work in places where you may be exposed to hepatitis B.  Haemophilus influenzae type b (Hib) vaccine. You may need this if you have certain risk factors. Talk to your health care provider about which screenings and vaccines you need and how often you need them. This information is not intended to replace advice given to you by your health care provider. Make sure you discuss any questions you have with your health care provider. Document Released: 02/26/2015 Document Revised: 03/22/2017 Document Reviewed: 12/01/2014 Elsevier Interactive Patient Education  2019 Elsevier Inc.  

## 2018-08-06 NOTE — Progress Notes (Signed)
Presents today for [employer] Visit   Date of last exam: 08/01/2017  Interpreter used for this visit? No  Patient seen in office at [facility]  Patient Care Team: Horald Pollen, MD as PCP - General (Internal Medicine) Nahser, Wonda Cheng, MD as Consulting Physician (Cardiology) Sharmon Revere as Physician Assistant (Cardiology)   Other items to address today:  Discussed eye/dental exams   Will make an appointment with eye doctor  Discussed immunizations   Other Screening: Last screening for diabetes:07/17/2017 Last lipid screening: 07/17/2017  ADVANCE DIRECTIVES: Discussed: yes On File: no copy requested Materials Provided:  no  Immunization status:  Immunization History  Administered Date(s) Administered  . Influenza-Unspecified 10/14/2012  . Pneumococcal Conjugate-13 07/10/2013  . Pneumococcal Polysaccharide-23 02/25/2007  . Td 07/10/2013  . Zoster 02/13/2013     There are no preventive care reminders to display for this patient.   Functional Status Survey: Is the patient deaf or have difficulty hearing?: Yes(seen audiologist does not have hearing aids at this time) Does the patient have difficulty seeing, even when wearing glasses/contacts?: No Does the patient have difficulty walking or climbing stairs?: No Does the patient have difficulty dressing or bathing?: No Does the patient have difficulty doing errands alone such as visiting a doctor's office or shopping?: No   6CIT Screen 08/06/2018  What Year? 0 points  What month? 0 points  What time? 0 points  Count back from 20 0 points  Months in reverse 0 points  Repeat phrase 0 points  Total Score 0        Clinical Support from 08/06/2018 in [facility] at [place]  AUDIT-C Score  0       Home Environment:  Lives in two story home with wife No grab bars Yes scattered rugs With grippers Adequate lighting   No trouble standing 30 second from chair     Patient Active Problem List   Diagnosis Date Noted  . Routine general medical examination at a health care facility 07/28/2016  . Thyroid nodule 10/21/2014  . Asymptomatic stenosis of right carotid artery 09/15/2014  . Other and unspecified hyperlipidemia 06/10/2012  . Abnormal ECG 06/06/2010  . Hypertension 06/06/2010     Past Medical History:  Diagnosis Date  . Arthritis   . Carotid artery occlusion   . Full dentures   . History of asthma    pt. states as a child  . History of bronchitis   . History of kidney stones   . History of pneumonia   . HOH (hard of hearing)   . Hyperlipidemia   . Hypertension   . Obesity   . PONV (postoperative nausea and vomiting)    pt. states that was sick with first surgery but 2nd surgery used patch behind ears and had no issues     Past Surgical History:  Procedure Laterality Date  . CAROTID ENDARTERECTOMY Right 09-15-14  . ENDARTERECTOMY Right 09/15/2014   Procedure: RIGHT CAROTID ENDARTERECTOMY WITH HEMASHIELD PATCH ANGIOPLASTY ;  Surgeon: Elam Dutch, MD;  Location: [facility];  Service: Vascular;  Laterality: Right;  . NASAL SINUS SURGERY    . SPINE SURGERY     C-7 disk surgery  by Dr. Regino Schultze      Family History  Problem Relation Age of Onset  . Hyperlipidemia Father   . Arthritis Sister      Social History   Socioeconomic History  . Marital status: Married  Spouse name: Not on file  . Number of children: Not on file  . Years of education: Not on file  . Highest education level: Not on file  Occupational History  . Not on file  Social Needs  . Financial resource strain: Not on file  . Food insecurity    Worry: Not on file    Inability: Not on file  . Transportation needs    Medical: Not on file    Non-medical: Not on file  Tobacco Use  . Smoking status: Former Smoker    Types: Cigarettes    Quit date: 06/06/1975    Years since quitting: 43.1  . Smokeless tobacco: Never Used  Substance and Sexual  Activity  . Alcohol use: No  . Drug use: No  . Sexual activity: Not on file  Lifestyle  . Physical activity    Days per week: Not on file    Minutes per session: Not on file  . Stress: Not on file  Relationships  . Social Musicianconnections    Talks on phone: Not on file    Gets together: Not on file    Attends religious service: Not on file    Active member of club or organization: Not on file    Attends meetings of clubs or organizations: Not on file    Relationship status: Not on file  . Intimate partner violence    Fear of current or ex partner: Not on file    Emotionally abused: Not on file    Physically abused: Not on file    Forced sexual activity: Not on file  Other Topics Concern  . Not on file  Social History Narrative   Married   Education: [employer]   Exercise: Yes     Allergies  Allergen Reactions  . Amoxicillin Hives and Rash  . Sulfa Antibiotics Other (See Comments)    Unknown  . Statins Other (See Comments)    Pt is intolerant-muscle aches and weakness     Prior to Admission medications   Medication Sig Start Date End Date Taking? Authorizing Provider  aspirin 81 MG tablet Take 81 mg by mouth daily.     Yes [provider]  Flaxseed, Linseed, (FLAX SEED OIL) 1000 MG CAPS Take 1 capsule by mouth daily. daily   Yes [provider]  fosinopril (MONOPRIL) 40 MG tablet Take 1 tablet (40 mg total) by mouth daily. 07/17/17  Yes Sagardia, Eilleen KempfMiguel Jose, MD  KRILL OIL PO Take 1 tablet by mouth daily.    Yes [provider]  Red Yeast Rice 600 MG TABS Take 600 mg by mouth daily. Daily   Yes [provider]  fenofibrate (TRICOR) 145 MG tablet Take 1 tablet (145 mg total) by mouth daily. 07/18/17   Georgina QuintSagardia, Miguel Jose, MD     Depression screen [facility] 2/9 08/06/2018 07/17/2017 07/28/2016 07/15/2015 10/21/2014  Decreased Interest 0 0 0 0 0  Down, Depressed, Hopeless 0 0 0 0 0  PHQ - 2 Score 0 0 0 0 0     Fall Risk  08/06/2018 07/17/2017  07/28/2016 07/15/2015 07/10/2013  Falls in the past year? 1 No No No No  Number falls in past yr: 0 - - - -  Injury with Fall? 0 - - - -  Follow up Falls evaluation completed;Education provided;Falls prevention discussed - - - -      PHYSICAL EXAM: BP (!) 150/86 (BP Location: Left Arm, Patient Position: Sitting, Cuff Size: Large)  Pulse 63   Temp (!) 97.2 F (36.2 C) (Oral)   Resp 16   Ht 6' (1.829 m)   Wt 215 lb 6.4 oz (97.7 kg)   SpO2 96%   BMI 29.21 kg/m    Wt Readings from Last 3 Encounters:  08/06/18 215 lb 6.4 oz (97.7 kg)  07/17/17 210 lb 6.4 oz (95.4 kg)  05/04/17 215 lb 9.6 oz (97.8 kg)      Hearing Screening   125Hz  250Hz  500Hz  1000Hz  2000Hz  3000Hz  4000Hz  6000Hz  8000Hz   Right ear:           Left ear:             Visual Acuity Screening   Right eye Left eye Both eyes  Without correction: 20-40-1 20-50 20-30  With correction:         Physical Exam   Education/Counseling provided regarding diet and exercise, prevention of chronic diseases, smoking/tobacco cessation, if applicable, and reviewed "Covered Medicare Preventive Services."

## 2018-08-06 NOTE — Progress Notes (Signed)
Eric Hahn 78 y.o.   Chief Complaint  Patient presents with  . Annual Exam    had medicare wellness this morning   . Medication Refill    BP meds  . Hypertension    HISTORY OF PRESENT ILLNESS: This is a 78 y.o. male with history of hypertension.  Needs medication refills.  Has no complaints or medical concerns today.  HPI   Prior to Admission medications   Medication Sig Start Date End Date Taking? Authorizing Provider  aspirin 81 MG tablet Take 81 mg by mouth daily.      [provider]  fenofibrate (TRICOR) 145 MG tablet Take 1 tablet (145 mg total) by mouth daily. 07/18/17   Jerica Creegan, Eilleen KempfMiguel Jose, MD  Flaxseed, Linseed, (FLAX SEED OIL) 1000 MG CAPS Take 1 capsule by mouth daily. daily    [provider]  fosinopril (MONOPRIL) 40 MG tablet Take 1 tablet (40 mg total) by mouth daily. 07/17/17   Georgina QuintSagardia, Rielly Corlett Jose, MD  KRILL OIL PO Take 1 tablet by mouth daily.     [provider]  Red Yeast Rice 600 MG TABS Take 600 mg by mouth daily. Daily    [provider]    Allergies  Allergen Reactions  . Amoxicillin Hives and Rash  . Sulfa Antibiotics Other (See Comments)    Unknown  . Statins Other (See Comments)    Pt is intolerant-muscle aches and weakness    Patient Active Problem List   Diagnosis Date Noted  . Routine general medical examination at a health care facility 07/28/2016  . Thyroid nodule 10/21/2014  . Asymptomatic stenosis of right carotid artery 09/15/2014  . Other and unspecified hyperlipidemia 06/10/2012  . Abnormal ECG 06/06/2010  . Hypertension 06/06/2010    Past Medical History:  Diagnosis Date  . Arthritis   . Carotid artery occlusion   . Full dentures   . History of asthma    pt. states as a child  . History of bronchitis   . History of kidney stones   . History of pneumonia   . HOH (hard of hearing)   . Hyperlipidemia   . Hypertension   . Obesity   . PONV (postoperative nausea and vomiting)    pt.  states that was sick with first surgery but 2nd surgery used patch behind ears and had no issues    Past Surgical History:  Procedure Laterality Date  . CAROTID ENDARTERECTOMY Right 09-15-14  . ENDARTERECTOMY Right 09/15/2014   Procedure: RIGHT CAROTID ENDARTERECTOMY WITH HEMASHIELD PATCH ANGIOPLASTY ;  Surgeon: Sherren Kernsharles E Fields, MD;  [facility];  Service: Vascular;  Laterality: Right;  . NASAL SINUS SURGERY    . SPINE SURGERY     C-7 disk surgery  by Dr. Darreld McleanPhilip Deaton     Social History   Socioeconomic History  . Marital status: Married    Spouse name: Not on file  . Number of children: Not on file  . Years of education: Not on file  . Highest education level: Not on file  Occupational History  . Not on file  Social Needs  . Financial resource strain: Not on file  . Food insecurity    Worry: Not on file    Inability: Not on file  . Transportation needs    Medical: Not on file    Non-medical: Not on file  Tobacco Use  . Smoking status: Former Smoker    Types: Cigarettes    Quit date: 06/06/1975  Years since quitting: 43.1  . Smokeless tobacco: Never Used  Substance and Sexual Activity  . Alcohol use: No  . Drug use: No  . Sexual activity: Not on file  Lifestyle  . Physical activity    Days per week: Not on file    Minutes per session: Not on file  . Stress: Not on file  Relationships  . Social Herbalist on phone: Not on file    Gets together: Not on file    Attends religious service: Not on file    Active member of club or organization: Not on file    Attends meetings of clubs or organizations: Not on file    Relationship status: Not on file  . Intimate partner violence    Fear of current or ex partner: Not on file    Emotionally abused: Not on file    Physically abused: Not on file    Forced sexual activity: Not on file  Other Topics Concern  . Not on file  Social History Narrative   Married   Education: High School   Exercise: Yes     Family History  Problem Relation Age of Onset  . Hyperlipidemia Father   . Arthritis Sister      Review of Systems  Constitutional: Negative.  Negative for chills and fever.  HENT: Negative.  Negative for congestion and sore throat.   Eyes: Negative.   Respiratory: Negative.  Negative for shortness of breath.   Cardiovascular: Negative.  Negative for chest pain and palpitations.  Gastrointestinal: Negative.  Negative for abdominal pain, diarrhea, nausea and vomiting.  Genitourinary: Negative.  Negative for dysuria.  Musculoskeletal: Negative.   Skin: Negative.  Negative for rash.  Neurological: Negative.  Negative for dizziness and headaches.  Endo/Heme/Allergies: Negative.    Vitals:   08/06/18 0917  BP: (!) 142/84  Pulse: 63  Temp: (!) 97.2 F (36.2 C)  SpO2: 96%     Physical Exam Vitals signs reviewed.  Constitutional:      Appearance: Normal appearance.  HENT:     Head: Normocephalic and atraumatic.     Nose: Nose normal.     Mouth/Throat:     Mouth: Mucous membranes are moist.     Pharynx: Oropharynx is clear.  Eyes:     Extraocular Movements: Extraocular movements intact.     Conjunctiva/sclera: Conjunctivae normal.     Pupils: Pupils are equal, round, and reactive to light.  Neck:     Musculoskeletal: Normal range of motion and neck supple.  Cardiovascular:     Rate and Rhythm: Normal rate.     Heart sounds: Normal heart sounds.  Pulmonary:     Effort: Pulmonary effort is normal.     Breath sounds: Normal breath sounds.  Musculoskeletal: Normal range of motion.     Right lower leg: No edema.     Left lower leg: No edema.  Skin:    General: Skin is warm and dry.     Capillary Refill: Capillary refill takes less than 2 seconds.  Neurological:     General: No focal deficit present.     Mental Status: He is alert and oriented to person, place, and time.  Psychiatric:        Mood and Affect: Mood normal.        Behavior: Behavior normal.       ASSESSMENT & PLAN: Eric Hahn was seen today for annual exam, medication refill and hypertension.  Diagnoses and all orders  for this visit:  Essential hypertension -     Lipid panel -     Comprehensive metabolic panel -     fosinopril (MONOPRIL) 40 MG tablet; Take 1 tablet (40 mg total) by mouth daily.  Annual physical exam -     Lipid panel -     Microalbumin, urine -     Cancel: PSA, Medicare -     Hemoglobin A1c -     Comprehensive metabolic panel -     PSA    Patient Instructions       If you have lab work done today you will be contacted with your lab results within the next 2 weeks.  If you have not heard from us then please contact us. The fastest way to get your results is to register for My Chart.   IF you received an x-ray today, you will receive an invoice from [facility]. Please contact [facility] at [phone] with questions or concerns regarding your invoice.   IF you received labwork today, you will receive an invoice from [facility]. Please contact [facility] at [phone] with questions or concerns regarding your invoice.   Our billing staff will not be able to assist you with questions regarding bills from these companies.  You will be contacted with the lab results as soon as they are available. The fastest way to get your results is to activate your My Chart account. Instructions are located on the last page of this paperwork. If you have not heard from us regarding the results in 2 weeks, please contact this office.      Health Maintenance After Age 77 After age 77, you are at a higher risk for certain long-term diseases and infections as well as injuries from falls. Falls are a major cause of broken bones and head injuries in people who are older than age 77. Getting regular preventive care can help to keep you healthy and well. Preventive care includes getting regular testing and making lifestyle changes as recommended by your health  care provider. Talk with your health care provider about:  Which screenings and tests you should have. A screening is a test that checks for a disease when you have no symptoms.  A diet and exercise plan that is right for you. What should I know about screenings and tests to prevent falls? Screening and testing are the best ways to find a health problem early. Early diagnosis and treatment give you the best chance of managing medical conditions that are common after age 77. Certain conditions and lifestyle choices may make you more likely to have a fall. Your health care provider may recommend:  Regular vision checks. Poor vision and conditions such as cataracts can make you more likely to have a fall. If you wear glasses, make sure to get your prescription updated if your vision changes.  Medicine review. Work with your health care provider to regularly review all of the medicines you are taking, including over-the-counter medicines. Ask your health care provider about any side effects that may make you more likely to have a fall. Tell your health care provider if any medicines that you take make you feel dizzy or sleepy.  Osteoporosis screening. Osteoporosis is a condition that causes the bones to get weaker. This can make the bones weak and cause them to break more easily.  Blood pressure screening. Blood pressure changes and medicines to control blood pressure can make you feel dizzy.  Strength and balance checks.  Your health care provider may recommend certain tests to check your strength and balance while standing, walking, or changing positions.  Foot health exam. Foot pain and numbness, as well as not wearing proper footwear, can make you more likely to have a fall.  Depression screening. You may be more likely to have a fall if you have a fear of falling, feel emotionally low, or feel unable to do activities that you used to do.  Alcohol use screening. Using too much alcohol can affect  your balance and may make you more likely to have a fall. What actions can I take to lower my risk of falls? General instructions  Talk with your health care provider about your risks for falling. Tell your health care provider if: ? You fall. Be sure to tell your health care provider about all falls, even ones that seem minor. ? You feel dizzy, sleepy, or off-balance.  Take over-the-counter and prescription medicines only as told by your health care provider. These include any supplements.  Eat a healthy diet and maintain a healthy weight. A healthy diet includes low-fat dairy products, low-fat (lean) meats, and fiber from whole grains, beans, and lots of fruits and vegetables. Home safety  Remove any tripping hazards, such as rugs, cords, and clutter.  Install safety equipment such as grab bars in bathrooms and safety rails on stairs.  Keep rooms and walkways well-lit. Activity   Follow a regular exercise program to stay fit. This will help you maintain your balance. Ask your health care provider what types of exercise are appropriate for you.  If you need a cane or walker, use it as recommended by your health care provider.  Wear supportive shoes that have nonskid soles. Lifestyle  Do not drink alcohol if your health care provider tells you not to drink.  If you drink alcohol, limit how much you have: ? 0-1 drink a day for women. ? 0-2 drinks a day for men.  Be aware of how much alcohol is in your drink. In the [place] one drink equals one typical bottle of beer (12 oz), one-half glass of wine (5 oz), or one shot of hard liquor (1 oz).  Do not use any products that contain nicotine or tobacco, such as cigarettes and e-cigarettes. If you need help quitting, ask your health care provider. Summary  Having a healthy lifestyle and getting preventive care can help to protect your health and wellness after age 77.  Screening and testing are the best way to find a health problem  early and help you avoid having a fall. Early diagnosis and treatment give you the best chance for managing medical conditions that are more common for people who are older than age 77.  Falls are a major cause of broken bones and head injuries in people who are older than age 77. Take precautions to prevent a fall at home.  Work with your health care provider to learn what changes you can make to improve your health and wellness and to prevent falls. This information is not intended to replace advice given to you by your health care provider. Make sure you discuss any questions you have with your health care provider. Document Released: 12/13/2016 Document Revised: 12/13/2016 Document Reviewed: 12/13/2016 Elsevier Interactive Patient Education  2019 [employer]      Edwina BarthMiguel Kaye Luoma, MD [facility]

## 2018-08-07 ENCOUNTER — Encounter: Payer: Self-pay | Admitting: Emergency Medicine

## 2018-08-07 LAB — LIPID PANEL
Chol/HDL Ratio: 7 ratio — ABNORMAL HIGH (ref 0.0–5.0)
Cholesterol, Total: 293 mg/dL — ABNORMAL HIGH (ref 100–199)
HDL: 42 mg/dL (ref >39)
LDL Calculated: 203 mg/dL — ABNORMAL HIGH (ref 0–99)
Triglycerides: 242 mg/dL — ABNORMAL HIGH (ref 0–149)
VLDL Cholesterol Cal: 48 mg/dL — ABNORMAL HIGH (ref 5–40)

## 2018-08-07 LAB — COMPREHENSIVE METABOLIC PANEL
ALT: 34 IU/L (ref 0–44)
AST: 26 IU/L (ref 0–40)
Albumin/Globulin Ratio: 1.8 (ref 1.2–2.2)
Albumin: 4.7 g/dL (ref 3.7–4.7)
Alkaline Phosphatase: 52 IU/L (ref 39–117)
BUN/Creatinine Ratio: 14 (ref 10–24)
BUN: 12 mg/dL (ref 8–27)
Bilirubin Total: 0.4 mg/dL (ref 0.0–1.2)
CO2: 19 mmol/L — ABNORMAL LOW (ref 20–29)
Calcium: 9.9 mg/dL (ref 8.6–10.2)
Chloride: 103 mmol/L (ref 96–106)
Creatinine, Ser: 0.87 mg/dL (ref 0.76–1.27)
GFR calc Af Amer: 97 mL/min/{1.73_m2} (ref >59)
GFR calc non Af Amer: 84 mL/min/{1.73_m2} (ref >59)
Globulin, Total: 2.6 g/dL (ref 1.5–4.5)
Glucose: 105 mg/dL — ABNORMAL HIGH (ref 65–99)
Potassium: 4.4 mmol/L (ref 3.5–5.2)
Sodium: 138 mmol/L (ref 134–144)
Total Protein: 7.3 g/dL (ref 6.0–8.5)

## 2018-08-07 LAB — HEMOGLOBIN A1C
Est. average glucose Bld gHb Est-mCnc: 126 mg/dL
Hgb A1c MFr Bld: 6 % — ABNORMAL HIGH (ref 4.8–5.6)

## 2018-08-07 LAB — MICROALBUMIN, URINE: Microalbumin, Urine: 14.7 ug/mL

## 2018-08-07 LAB — PSA: Prostate Specific Ag, Serum: 2.1 ng/mL (ref 0.0–4.0)

## 2019-02-03 ENCOUNTER — Ambulatory Visit: Payer: Medicare Other | Admitting: Emergency Medicine

## 2019-07-28 ENCOUNTER — Telehealth: Payer: Self-pay | Admitting: Emergency Medicine

## 2019-07-28 ENCOUNTER — Other Ambulatory Visit: Payer: Self-pay | Admitting: Emergency Medicine

## 2019-07-28 DIAGNOSIS — Z0000 Encounter for general adult medical examination without abnormal findings: Secondary | ICD-10-CM

## 2019-07-28 DIAGNOSIS — Z125 Encounter for screening for malignant neoplasm of prostate: Secondary | ICD-10-CM

## 2019-07-28 DIAGNOSIS — I10 Essential (primary) hypertension: Secondary | ICD-10-CM

## 2019-07-28 DIAGNOSIS — R7303 Prediabetes: Secondary | ICD-10-CM

## 2019-07-28 DIAGNOSIS — E785 Hyperlipidemia, unspecified: Secondary | ICD-10-CM

## 2019-07-28 NOTE — Telephone Encounter (Signed)
Labs has been preordered

## 2019-07-28 NOTE — Telephone Encounter (Signed)
Please orderfasting labs for physical scheduled for 09/23/2019 pt on nurses schedule 09/18/2019

## 2019-08-05 ENCOUNTER — Other Ambulatory Visit: Payer: Self-pay | Admitting: Emergency Medicine

## 2019-08-05 DIAGNOSIS — I10 Essential (primary) hypertension: Secondary | ICD-10-CM

## 2019-08-05 NOTE — Telephone Encounter (Signed)
Patient had no 6 month follow up on this med- does have appointment 09/18/19. Courtesy RF given #[phone]

## 2019-09-18 ENCOUNTER — Ambulatory Visit (INDEPENDENT_AMBULATORY_CARE_PROVIDER_SITE_OTHER): Payer: Medicare PPO | Admitting: Emergency Medicine

## 2019-09-18 ENCOUNTER — Other Ambulatory Visit: Payer: Self-pay

## 2019-09-18 DIAGNOSIS — Z125 Encounter for screening for malignant neoplasm of prostate: Secondary | ICD-10-CM | POA: Diagnosis not present

## 2019-09-18 DIAGNOSIS — R7303 Prediabetes: Secondary | ICD-10-CM | POA: Diagnosis not present

## 2019-09-18 DIAGNOSIS — E785 Hyperlipidemia, unspecified: Secondary | ICD-10-CM

## 2019-09-18 DIAGNOSIS — I10 Essential (primary) hypertension: Secondary | ICD-10-CM

## 2019-09-19 LAB — COMPREHENSIVE METABOLIC PANEL
ALT: 31 IU/L (ref 0–44)
AST: 21 IU/L (ref 0–40)
Albumin/Globulin Ratio: 1.6 (ref 1.2–2.2)
Albumin: 4.4 g/dL (ref 3.7–4.7)
Alkaline Phosphatase: 59 IU/L (ref 48–121)
BUN/Creatinine Ratio: 14 (ref 10–24)
BUN: 13 mg/dL (ref 8–27)
Bilirubin Total: 0.6 mg/dL (ref 0.0–1.2)
CO2: 23 mmol/L (ref 20–29)
Calcium: 9.9 mg/dL (ref 8.6–10.2)
Chloride: 101 mmol/L (ref 96–106)
Creatinine, Ser: 0.93 mg/dL (ref 0.76–1.27)
GFR calc Af Amer: 91 mL/min/{1.73_m2} (ref >59)
GFR calc non Af Amer: 79 mL/min/{1.73_m2} (ref >59)
Globulin, Total: 2.7 g/dL (ref 1.5–4.5)
Glucose: 103 mg/dL — ABNORMAL HIGH (ref 65–99)
Potassium: 4.4 mmol/L (ref 3.5–5.2)
Sodium: 139 mmol/L (ref 134–144)
Total Protein: 7.1 g/dL (ref 6.0–8.5)

## 2019-09-19 LAB — LIPID PANEL
Chol/HDL Ratio: 6.7 ratio — ABNORMAL HIGH (ref 0.0–5.0)
Cholesterol, Total: 276 mg/dL — ABNORMAL HIGH (ref 100–199)
HDL: 41 mg/dL (ref >39)
LDL Chol Calc (NIH): 200 mg/dL — ABNORMAL HIGH (ref 0–99)
Triglycerides: 184 mg/dL — ABNORMAL HIGH (ref 0–149)
VLDL Cholesterol Cal: 35 mg/dL (ref 5–40)

## 2019-09-19 LAB — HEMOGLOBIN A1C
Est. average glucose Bld gHb Est-mCnc: 128 mg/dL
Hgb A1c MFr Bld: 6.1 % — ABNORMAL HIGH (ref 4.8–5.6)

## 2019-09-19 LAB — MICROALBUMIN / CREATININE URINE RATIO
Creatinine, Urine: 147.5 mg/dL
Microalb/Creat Ratio: 6 mg/g creat (ref 0–29)
Microalbumin, Urine: 9.4 ug/mL

## 2019-09-19 LAB — PSA: Prostate Specific Ag, Serum: 2.5 ng/mL (ref 0.0–4.0)

## 2019-09-23 ENCOUNTER — Other Ambulatory Visit: Payer: Self-pay

## 2019-09-23 ENCOUNTER — Ambulatory Visit (INDEPENDENT_AMBULATORY_CARE_PROVIDER_SITE_OTHER): Payer: Medicare PPO | Admitting: Emergency Medicine

## 2019-09-23 ENCOUNTER — Encounter: Payer: Self-pay | Admitting: Emergency Medicine

## 2019-09-23 VITALS — BP 137/89 | HR 66 | Temp 98.0°F | Resp 16 | Ht 72.0 in | Wt 207.0 lb

## 2019-09-23 DIAGNOSIS — I10 Essential (primary) hypertension: Secondary | ICD-10-CM

## 2019-09-23 DIAGNOSIS — Z0000 Encounter for general adult medical examination without abnormal findings: Secondary | ICD-10-CM

## 2019-09-23 DIAGNOSIS — Z0001 Encounter for general adult medical examination with abnormal findings: Secondary | ICD-10-CM

## 2019-09-23 DIAGNOSIS — E785 Hyperlipidemia, unspecified: Secondary | ICD-10-CM | POA: Diagnosis not present

## 2019-09-23 DIAGNOSIS — Z789 Other specified health status: Secondary | ICD-10-CM

## 2019-09-23 MED ORDER — FOSINOPRIL SODIUM 40 MG PO TABS: ORAL_TABLET | ORAL | 3 refills | Status: DC

## 2019-09-23 NOTE — Progress Notes (Signed)
Eric Hahn 79 y.o.   Chief Complaint  Patient presents with  . Annual Exam    HISTORY OF PRESENT ILLNESS: This is a 79 y.o. male here for his annual exam. History of hypertension on Monopril 40 mg daily.  Doing well. BP Readings from Last 3 Encounters:  09/23/19 137/89  08/06/18 (!) 142/84  08/06/18 (!) 150/86  Recent blood work done.  Results reviewed with patient. Abnormal lipid profile.  States it is always abnormal.  Intolerant to multiple cholesterol medications.  Presently not taking anything.  Sees cardiologist on a regular basis. Blood work also shows prediabetes.  Normal kidney function test.  Normal liver enzymes.  No other concerns identified. Has no complaints or medical concerns today. Fully vaccinated against Covid.   HPI   Prior to Admission medications   Medication Sig Start Date End Date Taking? Authorizing Provider  aspirin 81 MG tablet Take 81 mg by mouth daily.     Yes [provider]  Flaxseed, Linseed, (FLAX SEED OIL) 1000 MG CAPS Take 1 capsule by mouth daily. daily   Yes [provider]  fosinopril (MONOPRIL) 40 MG tablet TAKE 1 TABLET(40 MG) BY MOUTH DAILY 08/05/19  Yes Hatem Cull, [place], MD  KRILL OIL PO Take 1 tablet by mouth daily.    Yes [provider]  Red Yeast Rice 600 MG TABS Take 600 mg by mouth daily. Daily   Yes [provider]  fenofibrate (TRICOR) 145 MG tablet Take 1 tablet (145 mg total) by mouth daily. 07/18/17   Georgina Quint, MD    Allergies  Allergen Reactions  . Amoxicillin Hives and Rash  . Sulfa Antibiotics Other (See Comments)    Unknown  . Statins Other (See Comments)    Pt is intolerant-muscle aches and weakness    Patient Active Problem List   Diagnosis Date Noted  . Thyroid nodule 10/21/2014  . Asymptomatic stenosis of right carotid artery 09/15/2014  . Other and unspecified hyperlipidemia 06/10/2012  . Abnormal ECG 06/06/2010  . Hypertension 06/06/2010    Past  Medical History:  Diagnosis Date  . Arthritis   . Carotid artery occlusion   . Full dentures   . History of asthma    pt. states as a child  . History of bronchitis   . History of kidney stones   . History of pneumonia   . HOH (hard of hearing)   . Hyperlipidemia   . Hypertension   . Obesity   . PONV (postoperative nausea and vomiting)    pt. states that was sick with first surgery but 2nd surgery used patch behind ears and had no issues    Past Surgical History:  Procedure Laterality Date  . CAROTID ENDARTERECTOMY Right 09-15-14  . ENDARTERECTOMY Right 09/15/2014   Procedure: RIGHT CAROTID ENDARTERECTOMY WITH HEMASHIELD PATCH ANGIOPLASTY ;  Surgeon: Sherren Kerns, MD;  Location: [facility] OR;  Service: Vascular;  Laterality: Right;  . NASAL SINUS SURGERY    . SPINE SURGERY     C-7 disk surgery  by Dr. Darreld Mclean     Social History   Socioeconomic History  . Marital status: Married    Spouse name: Not on file  . Number of children: Not on file  . Years of education: Not on file  . Highest education level: Not on file  Occupational History  . Not on file  Tobacco Use  . Smoking status: Former Smoker    Types: Cigarettes    Quit  date: 06/06/1975    Years since quitting: 44.3  . Smokeless tobacco: Never Used  Substance and Sexual Activity  . Alcohol use: No  . Drug use: No  . Sexual activity: Not on file  Other Topics Concern  . Not on file  Social History Narrative   Married   Education: [employer]   Exercise: Yes   Social Determinants of Health   Financial Resource Strain:   . Difficulty of Paying Living Expenses:   Food Insecurity:   . Worried About Programme researcher, broadcasting/film/videounning Out of Food in the Last Year:   . Baristaan Out of Food in the Last Year:   Transportation Needs:   . Freight forwarderLack of Transportation (Medical):   Marland Kitchen. Lack of Transportation (Non-Medical):   Physical Activity:   . Days of Exercise per Week:   . Minutes of Exercise per Session:   Stress:   . Feeling of Stress :     Social Connections:   . Frequency of Communication with Friends and Family:   . Frequency of Social Gatherings with Friends and Family:   . Attends Religious Services:   . Active Member of Clubs or Organizations:   . Attends BankerClub or Organization Meetings:   Marland Kitchen. Marital Status:   Intimate Partner Violence:   . Fear of Current or Ex-Partner:   . Emotionally Abused:   Marland Kitchen. Physically Abused:   . Sexually Abused:     Family History  Problem Relation Age of Onset  . Hyperlipidemia Father   . Arthritis Sister      Review of Systems  Constitutional: Negative.  Negative for chills and fever.  HENT: Positive for hearing loss. Negative for congestion and sore throat.   Respiratory: Negative.  Negative for cough and shortness of breath.   Cardiovascular: Negative for chest pain and palpitations.  Gastrointestinal: Negative.  Negative for abdominal pain, blood in stool, diarrhea, melena, nausea and vomiting.  Genitourinary: Negative.  Negative for dysuria and hematuria.  Musculoskeletal: Negative.  Negative for myalgias and neck pain.  Skin: Negative.   Neurological: Negative for dizziness and headaches.  All other systems reviewed and are negative.  Today's Vitals   09/23/19 0806  BP: 137/89  Pulse: 66  Resp: 16  Temp: 98 F (36.7 C)  TempSrc: Temporal  SpO2: 96%  Weight: 207 lb (93.9 kg)  Height: 6' (1.829 m)   Body mass index is 28.07 kg/m.   Physical Exam Vitals reviewed.  Constitutional:      Appearance: Normal appearance.  HENT:     Head: Normocephalic.  Eyes:     Extraocular Movements: Extraocular movements intact.     Conjunctiva/sclera: Conjunctivae normal.     Pupils: Pupils are equal, round, and reactive to light.  Cardiovascular:     Rate and Rhythm: Normal rate and regular rhythm.     Pulses: Normal pulses.     Heart sounds: Normal heart sounds.  Pulmonary:     Effort: Pulmonary effort is normal.     Breath sounds: Normal breath sounds.  Abdominal:      General: Bowel sounds are normal. There is no distension.     Palpations: Abdomen is soft.     Tenderness: There is no abdominal tenderness.  Musculoskeletal:        General: Normal range of motion.     Cervical back: Normal range of motion and neck supple. No tenderness.     Right lower leg: No edema.     Left lower leg: No edema.  Lymphadenopathy:  Cervical: No cervical adenopathy.  Skin:    General: Skin is warm and dry.     Capillary Refill: Capillary refill takes less than 2 seconds.  Neurological:     General: No focal deficit present.     Mental Status: He is alert and oriented to person, place, and time.  Psychiatric:        Mood and Affect: Mood normal.        Behavior: Behavior normal.      ASSESSMENT & PLAN: Eric Hahn was seen today for annual exam.  Diagnoses and all orders for this visit:  Routine general medical examination at a health care facility  Essential hypertension -     fosinopril (MONOPRIL) 40 MG tablet; TAKE 1 TABLET(40 MG) BY MOUTH DAILY  Statin intolerance  Dyslipidemia    Patient Instructions       If you have lab work done today you will be contacted with your lab results within the next 2 weeks.  If you have not heard from [place] then please contact us. The fastest way to get your results is to register for My Chart.   IF you received an x-ray today, you will receive an invoice from [facility]. Please contact [facility] at [phone] with questions or concerns regarding your invoice.   IF you received labwork today, you will receive an invoice from [facility]. Please contact [facility] at [phone] with questions or concerns regarding your invoice.   Our billing staff will not be able to assist you with questions regarding bills from these companies.  You will be contacted with the lab results as soon as they are available. The fastest way to get your results is to activate your My Chart account. Instructions are  located on the last page of this paperwork. If you have not heard from [place] regarding the results in 2 weeks, please contact this office.      Health Maintenance, Male Adopting a healthy lifestyle and getting preventive care are important in promoting health and wellness. Ask your health care provider about:  The right schedule for you to have regular tests and exams.  Things you can do on your own to prevent diseases and keep yourself healthy. What should I know about diet, weight, and exercise? Eat a healthy diet   Eat a diet that includes plenty of vegetables, fruits, low-fat dairy products, and lean protein.  Do not eat a lot of foods that are high in solid fats, added sugars, or sodium. Maintain a healthy weight Body mass index (BMI) is a measurement that can be used to identify possible weight problems. It estimates body fat based on height and weight. Your health care provider can help determine your BMI and help you achieve or maintain a healthy weight. Get regular exercise Get regular exercise. This is one of the most important things you can do for your health. Most adults should:  Exercise for at least 150 minutes each week. The exercise should increase your heart rate and make you sweat (moderate-intensity exercise).  Do strengthening exercises at least twice a week. This is in addition to the moderate-intensity exercise.  Spend less time sitting. Even light physical activity can be beneficial. Watch cholesterol and blood lipids Have your blood tested for lipids and cholesterol at 77 years of age, then have this test every 5 years. You may need to have your cholesterol levels checked more often if:  Your lipid or cholesterol levels are high.  You are older than 78  years of age.  You are at high risk for heart disease. What should I know about cancer screening? Many types of cancers can be detected early and may often be prevented. Depending on your health history and  family history, you may need to have cancer screening at various ages. This may include screening for:  Colorectal cancer.  Prostate cancer.  Skin cancer.  Lung cancer. What should I know about heart disease, diabetes, and high blood pressure? Blood pressure and heart disease  High blood pressure causes heart disease and increases the risk of stroke. This is more likely to develop in people who have high blood pressure readings, are of African descent, or are overweight.  Talk with your health care provider about your target blood pressure readings.  Have your blood pressure checked: ? Every 3-5 years if you are 7-61 years of age. ? Every year if you are 55 years old or older.  If you are between the ages of 68 and 72 and are a current or former smoker, ask your health care provider if you should have a one-time screening for abdominal aortic aneurysm (AAA). Diabetes Have regular diabetes screenings. This checks your fasting blood sugar level. Have the screening done:  Once every three years after age 86 if you are at a normal weight and have a low risk for diabetes.  More often and at a younger age if you are overweight or have a high risk for diabetes. What should I know about preventing infection? Hepatitis B If you have a higher risk for hepatitis B, you should be screened for this virus. Talk with your health care provider to find out if you are at risk for hepatitis B infection. Hepatitis C Blood testing is recommended for:  Everyone born from [birth year] through 1964.  Anyone with known risk factors for hepatitis C. Sexually transmitted infections (STIs)  You should be screened each year for STIs, including gonorrhea and chlamydia, if: ? You are sexually active and are younger than 78 years of age. ? You are older than 78 years of age and your health care provider tells you that you are at risk for this type of infection. ? Your sexual activity has changed since you were  last screened, and you are at increased risk for chlamydia or gonorrhea. Ask your health care provider if you are at risk.  Ask your health care provider about whether you are at high risk for HIV. Your health care provider may recommend a prescription medicine to help prevent HIV infection. If you choose to take medicine to prevent HIV, you should first get tested for HIV. You should then be tested every 3 months for as long as you are taking the medicine. Follow these instructions at home: Lifestyle  Do not use any products that contain nicotine or tobacco, such as cigarettes, e-cigarettes, and chewing tobacco. If you need help quitting, ask your health care provider.  Do not use street drugs.  Do not share needles.  Ask your health care provider for help if you need support or information about quitting drugs. Alcohol use  Do not drink alcohol if your health care provider tells you not to drink.  If you drink alcohol: ? Limit how much you have to 0-2 drinks a day. ? Be aware of how much alcohol is in your drink. In the [place] one drink equals one 12 oz bottle of beer (355 mL), one 5 oz glass of wine (148 mL), or one 1  oz glass of hard liquor (44 mL). General instructions  Schedule regular health, dental, and eye exams.  Stay current with your vaccines.  Tell your health care provider if: ? You often feel depressed. ? You have ever been abused or do not feel safe at home. Summary  Adopting a healthy lifestyle and getting preventive care are important in promoting health and wellness.  Follow your health care provider's instructions about healthy diet, exercising, and getting tested or screened for diseases.  Follow your health care provider's instructions on monitoring your cholesterol and blood pressure. This information is not intended to replace advice given to you by your health care provider. Make sure you discuss any questions you have with your health care  provider. Document Revised: 01/23/2018 Document Reviewed: 01/23/2018 Elsevier Patient Education  2020 Elsevier Inc.      Edwina Barth, MD [facility]

## 2019-09-23 NOTE — Patient Instructions (Addendum)
   If you have lab work done today you will be contacted with your lab results within the next 2 weeks.  If you have not heard from us then please contact us. The fastest way to get your results is to register for My Chart.   IF you received an x-ray today, you will receive an invoice from [facility]. Please contact [facility] at [phone] with questions or concerns regarding your invoice.   IF you received labwork today, you will receive an invoice from [facility]. Please contact [facility] at [phone] with questions or concerns regarding your invoice.   Our billing staff will not be able to assist you with questions regarding bills from these companies.  You will be contacted with the lab results as soon as they are available. The fastest way to get your results is to activate your My Chart account. Instructions are located on the last page of this paperwork. If you have not heard from us regarding the results in 2 weeks, please contact this office.      Health Maintenance, Male Adopting a healthy lifestyle and getting preventive care are important in promoting health and wellness. Ask your health care provider about:  The right schedule for you to have regular tests and exams.  Things you can do on your own to prevent diseases and keep yourself healthy. What should I know about diet, weight, and exercise? Eat a healthy diet   Eat a diet that includes plenty of vegetables, fruits, low-fat dairy products, and lean protein.  Do not eat a lot of foods that are high in solid fats, added sugars, or sodium. Maintain a healthy weight Body mass index (BMI) is a measurement that can be used to identify possible weight problems. It estimates body fat based on height and weight. Your health care provider can help determine your BMI and help you achieve or maintain a healthy weight. Get regular exercise Get regular exercise. This is one of the most important things you  can do for your health. Most adults should:  Exercise for at least 150 minutes each week. The exercise should increase your heart rate and make you sweat (moderate-intensity exercise).  Do strengthening exercises at least twice a week. This is in addition to the moderate-intensity exercise.  Spend less time sitting. Even light physical activity can be beneficial. Watch cholesterol and blood lipids Have your blood tested for lipids and cholesterol at 79 years of age, then have this test every 5 years. You may need to have your cholesterol levels checked more often if:  Your lipid or cholesterol levels are high.  You are older than 78 years of age.  You are at high risk for heart disease. What should I know about cancer screening? Many types of cancers can be detected early and may often be prevented. Depending on your health history and family history, you may need to have cancer screening at various ages. This may include screening for:  Colorectal cancer.  Prostate cancer.  Skin cancer.  Lung cancer. What should I know about heart disease, diabetes, and high blood pressure? Blood pressure and heart disease  High blood pressure causes heart disease and increases the risk of stroke. This is more likely to develop in people who have high blood pressure readings, are of African descent, or are overweight.  Talk with your health care provider about your target blood pressure readings.  Have your blood pressure checked: ? Every 3-5 years if you are 18-39   years of age. ? Every year if you are 40 years old or older.  If you are between the ages of 65 and 75 and are a current or former smoker, ask your health care provider if you should have a one-time screening for abdominal aortic aneurysm (AAA). Diabetes Have regular diabetes screenings. This checks your fasting blood sugar level. Have the screening done:  Once every three years after age 45 if you are at a normal weight and have  a low risk for diabetes.  More often and at a younger age if you are overweight or have a high risk for diabetes. What should I know about preventing infection? Hepatitis B If you have a higher risk for hepatitis B, you should be screened for this virus. Talk with your health care provider to find out if you are at risk for hepatitis B infection. Hepatitis C Blood testing is recommended for:  Everyone born from 1945 through 1965.  Anyone with known risk factors for hepatitis C. Sexually transmitted infections (STIs)  You should be screened each year for STIs, including gonorrhea and chlamydia, if: ? You are sexually active and are younger than 78 years of age. ? You are older than 78 years of age and your health care provider tells you that you are at risk for this type of infection. ? Your sexual activity has changed since you were last screened, and you are at increased risk for chlamydia or gonorrhea. Ask your health care provider if you are at risk.  Ask your health care provider about whether you are at high risk for HIV. Your health care provider may recommend a prescription medicine to help prevent HIV infection. If you choose to take medicine to prevent HIV, you should first get tested for HIV. You should then be tested every 3 months for as long as you are taking the medicine. Follow these instructions at home: Lifestyle  Do not use any products that contain nicotine or tobacco, such as cigarettes, e-cigarettes, and chewing tobacco. If you need help quitting, ask your health care provider.  Do not use street drugs.  Do not share needles.  Ask your health care provider for help if you need support or information about quitting drugs. Alcohol use  Do not drink alcohol if your health care provider tells you not to drink.  If you drink alcohol: ? Limit how much you have to 0-2 drinks a day. ? Be aware of how much alcohol is in your drink. In the [place] one drink equals one 12  oz bottle of beer (355 mL), one 5 oz glass of wine (148 mL), or one 1 oz glass of hard liquor (44 mL). General instructions  Schedule regular health, dental, and eye exams.  Stay current with your vaccines.  Tell your health care provider if: ? You often feel depressed. ? You have ever been abused or do not feel safe at home. Summary  Adopting a healthy lifestyle and getting preventive care are important in promoting health and wellness.  Follow your health care provider's instructions about healthy diet, exercising, and getting tested or screened for diseases.  Follow your health care provider's instructions on monitoring your cholesterol and blood pressure. This information is not intended to replace advice given to you by your health care provider. Make sure you discuss any questions you have with your health care provider. Document Revised: 01/23/2018 Document Reviewed: 01/23/2018 Elsevier Patient Education  2020 Elsevier Inc.  

## 2020-03-27 ENCOUNTER — Emergency Department
Admission: RE | Admit: 2020-03-27 | Discharge: 2020-03-27 | Disposition: A | Discharge status: Home / Self Care | Payer: Medicare PPO | Source: Ambulatory Visit

## 2020-03-27 ENCOUNTER — Emergency Department (INDEPENDENT_AMBULATORY_CARE_PROVIDER_SITE_OTHER): Payer: Medicare PPO

## 2020-03-27 ENCOUNTER — Other Ambulatory Visit: Payer: Self-pay

## 2020-03-27 VITALS — BP 126/75 | HR 85 | Temp 98.5°F | Resp 20 | Ht 72.0 in | Wt 208.0 lb

## 2020-03-27 DIAGNOSIS — R9431 Abnormal electrocardiogram [ECG] [EKG]: Secondary | ICD-10-CM

## 2020-03-27 DIAGNOSIS — J1282 Pneumonia due to coronavirus disease 2019: Secondary | ICD-10-CM

## 2020-03-27 DIAGNOSIS — U071 COVID-19: Secondary | ICD-10-CM

## 2020-03-27 DIAGNOSIS — I700 Atherosclerosis of aorta: Secondary | ICD-10-CM

## 2020-03-27 DIAGNOSIS — J189 Pneumonia, unspecified organism: Secondary | ICD-10-CM | POA: Diagnosis not present

## 2020-03-27 DIAGNOSIS — I499 Cardiac arrhythmia, unspecified: Secondary | ICD-10-CM

## 2020-03-27 DIAGNOSIS — R059 Cough, unspecified: Secondary | ICD-10-CM | POA: Diagnosis not present

## 2020-03-27 DIAGNOSIS — R0602 Shortness of breath: Secondary | ICD-10-CM | POA: Diagnosis not present

## 2020-03-27 MED ORDER — PREDNISONE 20 MG PO TABS: 40.0000 mg | ORAL_TABLET | Freq: Every day | ORAL | 0 refills | Status: DC

## 2020-03-27 MED ORDER — DOXYCYCLINE MONOHYDRATE 100 MG PO CAPS: 100.0000 mg | ORAL_CAPSULE | Freq: Two times a day (BID) | ORAL | 0 refills | Status: AC

## 2020-03-27 MED ORDER — AZITHROMYCIN 250 MG PO TABS: ORAL_TABLET | ORAL | 0 refills | Status: DC

## 2020-03-27 MED ORDER — BENZONATATE 100 MG PO CAPS: 100.0000 mg | ORAL_CAPSULE | Freq: Three times a day (TID) | ORAL | 0 refills | Status: DC | PRN

## 2020-03-27 NOTE — Discharge Instructions (Addendum)
Your heart rhythm on exam today was abnormal.  EKG shows some extra beats in between your cardiac cycle therefore you warrant further evaluation by a cardiologist.  I have placed an urgent referral for you to be seen at both our cardiology in [facility] through [facility] health.  Someone will reach out to you via phone to get you scheduled for an appointment.  You also are being diagnosed today with COVID-19 pneumonia that would explain your symptoms of weakness and fatigue and persistent cough.  You will be taking 2 antibiotics azithromycin and doxycycline complete both medications as prescribed.  To improve symptoms of congestion and and improve breathing I have prescribed you prednisone 40 mg once daily over the course of 5 days. For your cough I have prescribed you Tessalon Perles 100 mg 3 times daily as needed for cough.  If you experience any worsening shortness of breath or chest pain go immediately to the emergency department for further work-up and evaluation.

## 2020-03-27 NOTE — ED Triage Notes (Signed)
Pt presents to Urgent Care with c/o persistent cough and increased nasal congestion and fatigue since being dx w/ COVID this past week. Reports initial symptoms began one week ago today. He has been vaccinated.

## 2020-03-27 NOTE — ED Provider Notes (Signed)
[facility]    CSN: [id number] Arrival date & time: 03/27/20  1227      History   Chief Complaint Chief Complaint  Patient presents with  . Cough    COVID positive  . Nasal Congestion  . Weakness    HPI Eric Hahn is a 80 y.o. male.   HPI  Patient presents today for evaluation of ongoing COVID-19 symptoms of cough, nasal congestion and some shortness of breath.  Patient was diagnosed with Covid over a week ago.  He reports that he has been self managing symptoms with over-the-counter medications with intermittent relief.  He has noticed over the last few days has gotten progressively fatigued and weak and experienced worse of a cough which is now productive.  He denies any chest pain or chest tightness.  He endorses a sensation of congestion in the chest.  On arrival today his oxygen level is 94%.  His last oxygen level during a period of wellness at his last PCP appointment 96%.  He endorses that he is able to eat and drink at his baseline.  He denies any symptoms of dizziness or headache.  He endorses some nasal congestion.  He has been managing symptoms at home with Mucinex. Past Medical History:  Diagnosis Date  . Arthritis   . Carotid artery occlusion   . Full dentures   . History of asthma    pt. states as a child  . History of bronchitis   . History of kidney stones   . History of pneumonia   . HOH (hard of hearing)   . Hyperlipidemia   . Hypertension   . Obesity   . PONV (postoperative nausea and vomiting)    pt. states that was sick with first surgery but 2nd surgery used patch behind ears and had no issues    Patient Active Problem List   Diagnosis Date Noted  . Thyroid nodule 10/21/2014  . Asymptomatic stenosis of right carotid artery 09/15/2014  . Other and unspecified hyperlipidemia 06/10/2012  . Abnormal ECG 06/06/2010  . Hypertension 06/06/2010    Past Surgical History:  Procedure Laterality Date  . CAROTID ENDARTERECTOMY Right 09-15-14   . ENDARTERECTOMY Right 09/15/2014   Procedure: RIGHT CAROTID ENDARTERECTOMY WITH HEMASHIELD PATCH ANGIOPLASTY ;  Surgeon: Sherren Kerns, MD;  Location: [facility] OR;  Service: Vascular;  Laterality: Right;  . NASAL SINUS SURGERY    . SPINE SURGERY     C-7 disk surgery  by Dr. Darreld Mclean        Home Medications    Prior to Admission medications   Medication Sig Start Date End Date Taking? Authorizing Provider  dextromethorphan-guaiFENesin (MUCINEX DM) 30-600 MG 12hr tablet Take 1 tablet by mouth 2 (two) times daily.   Yes [provider]  aspirin 81 MG tablet Take 81 mg by mouth daily.      [provider]  Flaxseed, Linseed, (FLAX SEED OIL) 1000 MG CAPS Take 1 capsule by mouth daily. daily    [provider]  fosinopril (MONOPRIL) 40 MG tablet TAKE 1 TABLET(40 MG) BY MOUTH DAILY 09/23/19   Georgina Quint, MD  KRILL OIL PO Take 1 tablet by mouth daily.     [provider]  Red Yeast Rice 600 MG TABS Take 600 mg by mouth daily. Daily    [provider]    Family History Family History  Problem Relation Age of Onset  . Hyperlipidemia Father   . Arthritis Mother   .  Arthritis Sister     Social History Social History   Tobacco Use  . Smoking status: Former Smoker    Types: Cigarettes    Quit date: 06/06/1975    Years since quitting: 44.8  . Smokeless tobacco: Never Used  Substance Use Topics  . Alcohol use: No  . Drug use: No     Allergies   Amoxicillin, Sulfa antibiotics, and Statins   Review of Systems Review of Systems Pertinent negatives listed in HPI   Physical Exam Triage Vital Signs ED Triage Vitals  Enc Vitals Group     BP 03/27/20 1248 126/75     Pulse Rate 03/27/20 1248 85     Resp 03/27/20 1248 20     Temp 03/27/20 1248 98.5 F (36.9 C)     Temp Source 03/27/20 1248 Oral     SpO2 03/27/20 1248 94 %     Weight 03/27/20 1248 208 lb (94.3 kg)     Height 03/27/20 1248 6' (1.829 m)     Head  Circumference --      Peak Flow --      Pain Score 03/27/20 1243 5     Pain Loc --      Pain Edu? --      Excl. in GC? --    No data found.  Updated Vital Signs BP 126/75 (BP Location: Right Arm)   Pulse 85   Temp 98.5 F (36.9 C) (Oral)   Resp 20   Ht 6' (1.829 m)   Wt 208 lb (94.3 kg)   SpO2 94%   BMI 28.21 kg/m   Visual Acuity Right Eye Distance:   Left Eye Distance:   Bilateral Distance:    Right Eye Near:   Left Eye Near:    Bilateral Near:     Physical Exam Constitutional:      General: He is not in acute distress.    Appearance: He is not toxic-appearing.     Comments: Chronically ill-appearing  Cardiovascular:     Rate and Rhythm: Normal rate. Rhythm irregular.     Pulses: Normal pulses.     Comments: No appreciable edema involving the lower extremities Pulmonary:     Breath sounds: Decreased air movement present. Examination of the right-middle field reveals rhonchi. Examination of the left-middle field reveals rhonchi. Examination of the right-lower field reveals rhonchi. Examination of the left-lower field reveals rhonchi. Rhonchi present.  Skin:    General: Skin is warm.     Capillary Refill: Capillary refill takes less than 2 seconds.  Neurological:     General: No focal deficit present.     GCS: GCS eye subscore is 4. GCS verbal subscore is 5. GCS motor subscore is 6.     Coordination: Coordination normal.     Gait: Gait is intact.  Psychiatric:        Attention and Perception: Attention normal.        Mood and Affect: Mood normal.      UC Treatments / Results  Labs (all labs ordered are listed, but only abnormal results are displayed) Labs Reviewed - No data to display  EKG EKG normal sinus rhythm with frequent PVCs, 80 bpm  Radiology No results found.  Procedures Procedures (including critical care time)  Medications Ordered in UC Medications - No data to display  Initial Impression / Assessment and Plan / UC Course  I have  reviewed the triage vital signs and the nursing notes.  Pertinent labs & imaging  results that were available during my care of the patient were reviewed by me and considered in my medical decision making (see chart for details).    Patient presents today for evaluation of ongoing worsening symptoms since diagnosed with COVID-19 over a week ago.  On exam patient had an abnormal heart beat further evaluated with EKG revealed normal sinus rhythm with frequent PVCs.  Patient has a history of carotid stenosis and hypertension however no previous evaluation for any structural cardiovascular disease and no diagnosis of CAD.  Patient is asymptomatic of chest pain today.  We will go ahead and cover for Covid pneumonia however placed an urgent referral for patient to have a cardiac work-up through heart care cardiology in [facility]. Patient given strict ER precautions if he experiencing any difficulty breathing or any chest pain or chest pressure he warrants immediate evaluation in the setting of the emergency department.  Patient verbalized understanding and agreement with plan. Final Clinical Impressions(s) / UC Diagnoses   Final diagnoses:  Abnormal ECG  Irregular heart beat  Pneumonia due to COVID-19 virus     Discharge Instructions     Your heart rhythm on exam today was abnormal.  EKG shows some extra beats in between your cardiac cycle therefore you warrant further evaluation by a cardiologist.  I have placed an urgent referral for you to be seen at both our cardiology in [facility] through [facility] health.  Someone will reach out to you via phone to get you scheduled for an appointment.  You also are being diagnosed today with COVID-19 pneumonia that would explain your symptoms of weakness and fatigue and persistent cough.  You will be taking 2 antibiotics azithromycin and doxycycline complete both medications as prescribed.  To improve symptoms of congestion and and improve breathing I have  prescribed you prednisone 40 mg once daily over the course of 5 days. For your cough I have prescribed you Tessalon Perles 100 mg 3 times daily as needed for cough.  If you experience any worsening shortness of breath or chest pain go immediately to the emergency department for further work-up and evaluation.    ED Prescriptions    Medication Sig Dispense Auth. Provider   predniSONE (DELTASONE) 20 MG tablet Take 2 tablets (40 mg total) by mouth daily with breakfast. 10 tablet Bing Neighbors, FNP   doxycycline (MONODOX) 100 MG capsule Take 1 capsule (100 mg total) by mouth 2 (two) times daily for 7 days. 14 capsule Bing Neighbors, FNP   benzonatate (TESSALON) 100 MG capsule Take 1 capsule (100 mg total) by mouth 3 (three) times daily as needed for cough. 40 capsule Bing Neighbors, FNP   azithromycin (ZITHROMAX) 250 MG tablet Take 2 tabs PO x 1 dose, then 1 tab PO QD x 4 days 6 tablet Bing Neighbors, FNP     PDMP not reviewed this encounter.   Bing Neighbors, FNP 03/27/20 [phone]

## 2020-10-19 ENCOUNTER — Encounter: Payer: Self-pay | Admitting: Emergency Medicine

## 2020-10-19 ENCOUNTER — Other Ambulatory Visit: Payer: Self-pay

## 2020-10-19 ENCOUNTER — Ambulatory Visit (INDEPENDENT_AMBULATORY_CARE_PROVIDER_SITE_OTHER): Payer: Medicare PPO | Admitting: Emergency Medicine

## 2020-10-19 ENCOUNTER — Other Ambulatory Visit: Payer: Self-pay | Admitting: Emergency Medicine

## 2020-10-19 VITALS — BP 138/80 | HR 65 | Temp 98.7°F | Ht 72.0 in | Wt 210.0 lb

## 2020-10-19 DIAGNOSIS — Z0000 Encounter for general adult medical examination without abnormal findings: Secondary | ICD-10-CM

## 2020-10-19 DIAGNOSIS — Z130 Encounter for screening for diseases of the blood and blood-forming organs and certain disorders involving the immune mechanism: Secondary | ICD-10-CM

## 2020-10-19 DIAGNOSIS — I10 Essential (primary) hypertension: Secondary | ICD-10-CM | POA: Diagnosis not present

## 2020-10-19 DIAGNOSIS — R7303 Prediabetes: Secondary | ICD-10-CM

## 2020-10-19 DIAGNOSIS — Z13220 Encounter for screening for lipoid disorders: Secondary | ICD-10-CM

## 2020-10-19 DIAGNOSIS — Z13228 Encounter for screening for other metabolic disorders: Secondary | ICD-10-CM

## 2020-10-19 DIAGNOSIS — Z1329 Encounter for screening for other suspected endocrine disorder: Secondary | ICD-10-CM

## 2020-10-19 DIAGNOSIS — E785 Hyperlipidemia, unspecified: Secondary | ICD-10-CM

## 2020-10-19 LAB — CBC WITH DIFFERENTIAL/PLATELET
Basophils Absolute: 0 10*3/uL (ref 0.0–0.1)
Basophils Relative: 0.9 % (ref 0.0–3.0)
Eosinophils Absolute: 0.6 10*3/uL (ref 0.0–0.7)
Eosinophils Relative: 11.7 % — ABNORMAL HIGH (ref 0.0–5.0)
HCT: 41.7 % (ref 39.0–52.0)
Hemoglobin: 14 g/dL (ref 13.0–17.0)
Lymphocytes Relative: 38.2 % (ref 12.0–46.0)
Lymphs Abs: 1.8 10*3/uL (ref 0.7–4.0)
MCHC: 33.6 g/dL (ref 30.0–36.0)
MCV: 90.5 fl (ref 78.0–100.0)
Monocytes Absolute: 0.5 10*3/uL (ref 0.1–1.0)
Monocytes Relative: 10 % (ref 3.0–12.0)
Neutro Abs: 1.9 10*3/uL (ref 1.4–7.7)
Neutrophils Relative %: 39.2 % — ABNORMAL LOW (ref 43.0–77.0)
Platelets: 215 10*3/uL (ref 150.0–400.0)
RBC: 4.61 Mil/uL (ref 4.22–5.81)
RDW: 13.4 % (ref 11.5–15.5)
WBC: 4.8 10*3/uL (ref 4.0–10.5)

## 2020-10-19 LAB — COMPREHENSIVE METABOLIC PANEL
ALT: 19 U/L (ref 0–53)
AST: 18 U/L (ref 0–37)
Albumin: 4.2 g/dL (ref 3.5–5.2)
Alkaline Phosphatase: 58 U/L (ref 39–117)
BUN: 15 mg/dL (ref 6–23)
CO2: 26 mEq/L (ref 19–32)
Calcium: 9.3 mg/dL (ref 8.4–10.5)
Chloride: 104 mEq/L (ref 96–112)
Creatinine, Ser: 0.87 mg/dL (ref 0.40–1.50)
GFR: 82.34 mL/min (ref >60.00)
Glucose, Bld: 98 mg/dL (ref 70–99)
Potassium: 4.1 mEq/L (ref 3.5–5.1)
Sodium: 138 mEq/L (ref 135–145)
Total Bilirubin: 0.5 mg/dL (ref 0.2–1.2)
Total Protein: 7.5 g/dL (ref 6.0–8.3)

## 2020-10-19 LAB — HEMOGLOBIN A1C: Hgb A1c MFr Bld: 6.1 % (ref 4.6–6.5)

## 2020-10-19 LAB — LIPID PANEL
Cholesterol: 237 mg/dL — ABNORMAL HIGH (ref 0–200)
HDL: 37.4 mg/dL — ABNORMAL LOW (ref >39.00)
LDL Cholesterol: 172 mg/dL — ABNORMAL HIGH (ref 0–99)
NonHDL: 199.72
Total CHOL/HDL Ratio: 6
Triglycerides: 140 mg/dL (ref 0.0–149.0)
VLDL: 28 mg/dL (ref 0.0–40.0)

## 2020-10-19 MED ORDER — EZETIMIBE 10 MG PO TABS
10.0000 mg | ORAL_TABLET | Freq: Every day | ORAL | 3 refills | Status: DC
Start: 2020-10-19 — End: 2022-10-25

## 2020-10-19 MED ORDER — FOSINOPRIL SODIUM 40 MG PO TABS: ORAL_TABLET | ORAL | 3 refills | Status: DC

## 2020-10-19 NOTE — Patient Instructions (Signed)
Health Maintenance, Male Adopting a healthy lifestyle and getting preventive care are important in promoting health and wellness. Ask your health care provider about: The right schedule for you to have regular tests and exams. Things you can do on your own to prevent diseases and keep yourself healthy. What should I know about diet, weight, and exercise? Eat a healthy diet  Eat a diet that includes plenty of vegetables, fruits, low-fat dairy products, and lean protein. Do not eat a lot of foods that are high in solid fats, added sugars, or sodium. Maintain a healthy weight Body mass index (BMI) is a measurement that can be used to identify possible weight problems. It estimates body fat based on height and weight. Your health care provider can help determine your BMI and help you achieve or maintain a healthy weight. Get regular exercise Get regular exercise. This is one of the most important things you can do for your health. Most adults should: Exercise for at least 150 minutes each week. The exercise should increase your heart rate and make you sweat (moderate-intensity exercise). Do strengthening exercises at least twice a week. This is in addition to the moderate-intensity exercise. Spend less time sitting. Even light physical activity can be beneficial. Watch cholesterol and blood lipids Have your blood tested for lipids and cholesterol at 79 years of age, then have this test every 5 years. You may need to have your cholesterol levels checked more often if: Your lipid or cholesterol levels are high. You are older than 79 years of age. You are at high risk for heart disease. What should I know about cancer screening? Many types of cancers can be detected early and may often be prevented. Depending on your health history and family history, you may need to have cancer screening at various ages. This may include screening for: Colorectal cancer. Prostate cancer. Skin cancer. Lung  cancer. What should I know about heart disease, diabetes, and high blood pressure? Blood pressure and heart disease High blood pressure causes heart disease and increases the risk of stroke. This is more likely to develop in people who have high blood pressure readings, are of African descent, or are overweight. Talk with your health care provider about your target blood pressure readings. Have your blood pressure checked: Every 3-5 years if you are 18-39 years of age. Every year if you are 40 years old or older. If you are between the ages of 65 and 75 and are a current or former smoker, ask your health care provider if you should have a one-time screening for abdominal aortic aneurysm (AAA). Diabetes Have regular diabetes screenings. This checks your fasting blood sugar level. Have the screening done: Once every three years after age 45 if you are at a normal weight and have a low risk for diabetes. More often and at a younger age if you are overweight or have a high risk for diabetes. What should I know about preventing infection? Hepatitis B If you have a higher risk for hepatitis B, you should be screened for this virus. Talk with your health care provider to find out if you are at risk for hepatitis B infection. Hepatitis C Blood testing is recommended for: Everyone born from 1945 through 1965. Anyone with known risk factors for hepatitis C. Sexually transmitted infections (STIs) You should be screened each year for STIs, including gonorrhea and chlamydia, if: You are sexually active and are younger than 79 years of age. You are older than 79 years   of age and your health care provider tells you that you are at risk for this type of infection. Your sexual activity has changed since you were last screened, and you are at increased risk for chlamydia or gonorrhea. Ask your health care provider if you are at risk. Ask your health care provider about whether you are at high risk for HIV.  Your health care provider may recommend a prescription medicine to help prevent HIV infection. If you choose to take medicine to prevent HIV, you should first get tested for HIV. You should then be tested every 3 months for as long as you are taking the medicine. Follow these instructions at home: Lifestyle Do not use any products that contain nicotine or tobacco, such as cigarettes, e-cigarettes, and chewing tobacco. If you need help quitting, ask your health care provider. Do not use street drugs. Do not share needles. Ask your health care provider for help if you need support or information about quitting drugs. Alcohol use Do not drink alcohol if your health care provider tells you not to drink. If you drink alcohol: Limit how much you have to 0-2 drinks a day. Be aware of how much alcohol is in your drink. In the [place] one drink equals one 12 oz bottle of beer (355 mL), one 5 oz glass of wine (148 mL), or one 1 oz glass of hard liquor (44 mL). General instructions Schedule regular health, dental, and eye exams. Stay current with your vaccines. Tell your health care provider if: You often feel depressed. You have ever been abused or do not feel safe at home. Summary Adopting a healthy lifestyle and getting preventive care are important in promoting health and wellness. Follow your health care provider's instructions about healthy diet, exercising, and getting tested or screened for diseases. Follow your health care provider's instructions on monitoring your cholesterol and blood pressure. This information is not intended to replace advice given to you by your health care provider. Make sure you discuss any questions you have with your health care provider. Document Revised: 04/09/2020 Document Reviewed: 01/23/2018 Elsevier Patient Education  2022 [employer]  

## 2020-10-19 NOTE — Progress Notes (Signed)
Eric Hahn 80 y.o.   Chief Complaint  Patient presents with   Annual Exam    Pt would like a refill of fosinopril     HISTORY OF PRESENT ILLNESS: This is a 80 y.o. male here for annual exam.  Has history of hypertension on Monopril 40 mg daily. Had COVID infection last January with pneumonia.  Lasted about 5 days.  No complications. Doing well.  Has no complaints or medical concerns today.  HPI   Prior to Admission medications   Medication Sig Start Date End Date Taking? Authorizing Provider  aspirin 81 MG tablet Take 81 mg by mouth daily.     Yes [provider]  Flaxseed, Linseed, (FLAX SEED OIL) 1000 MG CAPS Take 1 capsule by mouth daily. daily   Yes [provider]  fosinopril (MONOPRIL) 40 MG tablet TAKE 1 TABLET(40 MG) BY MOUTH DAILY 09/23/19  Yes Eric Hahn, [place], MD  KRILL OIL PO Take 1 tablet by mouth daily.    Yes [provider]  Red Yeast Rice 600 MG TABS Take 600 mg by mouth daily. Daily   Yes [provider]  azithromycin (ZITHROMAX) 250 MG tablet Take 2 tabs PO x 1 dose, then 1 tab PO QD x 4 days 03/27/20   Eric Neighbors, FNP  benzonatate (TESSALON) 100 MG capsule Take 1 capsule (100 mg total) by mouth 3 (three) times daily as needed for cough. 03/27/20   Eric Neighbors, FNP  dextromethorphan-guaiFENesin [facility] DM) 30-600 MG 12hr tablet Take 1 tablet by mouth 2 (two) times daily.    [provider]  predniSONE (DELTASONE) 20 MG tablet Take 2 tablets (40 mg total) by mouth daily with breakfast. 03/27/20   Eric Neighbors, FNP    Allergies  Allergen Reactions   Amoxicillin Hives and Rash   Sulfa Antibiotics Other (See Comments)    Unknown   Statins Other (See Comments)    Pt is intolerant-muscle aches and weakness    Patient Active Problem List   Diagnosis Date Noted   Thyroid nodule 10/21/2014   Asymptomatic stenosis of right carotid artery 09/15/2014   Other and unspecified hyperlipidemia  06/10/2012   Abnormal ECG 06/06/2010   Hypertension 06/06/2010    Past Medical History:  Diagnosis Date   Arthritis    Carotid artery occlusion    Full dentures    History of asthma    pt. states as a child   History of bronchitis    History of kidney stones    History of pneumonia    HOH (hard of hearing)    Hyperlipidemia    Hypertension    Obesity    PONV (postoperative nausea and vomiting)    pt. states that was sick with first surgery but 2nd surgery used patch behind ears and had no issues    Past Surgical History:  Procedure Laterality Date   CAROTID ENDARTERECTOMY Right 09-15-14   ENDARTERECTOMY Right 09/15/2014   Procedure: RIGHT CAROTID ENDARTERECTOMY WITH HEMASHIELD PATCH ANGIOPLASTY ;  Surgeon: Eric Kerns, MD;  Location: [facility] OR;  Service: Vascular;  Laterality: Right;   NASAL SINUS SURGERY     SPINE SURGERY     C-7 disk surgery  by Dr. Darreld Hahn     Social History   Socioeconomic History   Marital status: Married    Spouse name: Not on file   Number of children: Not on file   Years of education: Not on file   Highest  education level: Not on file  Occupational History   Not on file  Tobacco Use   Smoking status: Former    Types: Cigarettes    Quit date: 06/06/1975    Years since quitting: 45.4   Smokeless tobacco: Never  Substance and Sexual Activity   Alcohol use: No   Drug use: No   Sexual activity: Not on file  Other Topics Concern   Not on file  Social History Narrative   Married   Education: High School   Exercise: Yes   Social Determinants of Health   Financial Resource Strain: Not on file  Food Insecurity: Not on file  Transportation Needs: Not on file  Physical Activity: Not on file  Stress: Not on file  Social Connections: Not on file  Intimate Partner Violence: Not on file    Family History  Problem Relation Age of Onset   Hyperlipidemia Father    Arthritis Mother    Arthritis Sister      Review of Systems   Constitutional: Negative.  Negative for chills and fever.  HENT: Negative.  Negative for congestion and sore throat.   Respiratory: Negative.  Negative for cough and shortness of breath.   Cardiovascular:  Negative for chest pain and palpitations.  Gastrointestinal: Negative.  Negative for abdominal pain, diarrhea, nausea and vomiting.  Genitourinary: Negative.  Negative for dysuria and hematuria.  Musculoskeletal: Negative.   Skin: Negative.  Negative for rash.  Neurological: Negative.  Negative for dizziness and headaches.  All other systems reviewed and are negative.   Physical Exam Vitals reviewed.  Constitutional:      Appearance: Normal appearance.  HENT:     Head: Normocephalic.     Right Ear: Tympanic membrane, ear canal and external ear normal.     Left Ear: Tympanic membrane, ear canal and external ear normal.  Eyes:     Extraocular Movements: Extraocular movements intact.     Conjunctiva/sclera: Conjunctivae normal.     Pupils: Pupils are equal, round, and reactive to light.  Cardiovascular:     Rate and Rhythm: Normal rate and regular rhythm.     Pulses: Normal pulses.     Heart sounds: Normal heart sounds.  Pulmonary:     Effort: Pulmonary effort is normal.     Breath sounds: Normal breath sounds.  Abdominal:     General: Bowel sounds are normal. There is no distension.     Palpations: Abdomen is soft. There is no mass.  Musculoskeletal:        General: Normal range of motion.     Cervical back: Normal range of motion and neck supple.  Skin:    General: Skin is warm and dry.     Capillary Refill: Capillary refill takes less than 2 seconds.  Neurological:     General: No focal deficit present.     Mental Status: He is alert and oriented to person, place, and time.  Psychiatric:        Mood and Affect: Mood normal.        Behavior: Behavior normal.     ASSESSMENT & PLAN: [name] was seen today for annual exam.  Diagnoses and all orders for this  visit:  Routine general medical examination at a health care facility  Primary hypertension  Prediabetes -     Hemoglobin A1c  Essential hypertension -     fosinopril (MONOPRIL) 40 MG tablet; TAKE 1 TABLET(40 MG) BY MOUTH DAILY -     Comprehensive metabolic panel -  Lipid panel  Screening for deficiency anemia -     CBC with Differential  Screening for lipoid disorders -     Lipid panel  Screening for endocrine, metabolic and immunity disorder  Modifiable risk factors discussed with patient. Anticipatory guidance according to age provided. The following topics were also discussed: Social Determinants of Health Smoking Diet and nutrition Benefits of exercise Cancer screening  Vaccinations recommendations Cardiovascular risk assessment Hypertension, medication review, management Mental health including depression and anxiety Fall and accident prevention  Patient Instructions  Health Maintenance, Male Adopting a healthy lifestyle and getting preventive care are important in promoting health and wellness. Ask your health care provider about: The right schedule for you to have regular tests and exams. Things you can do on your own to prevent diseases and keep yourself healthy. What should I know about diet, weight, and exercise? Eat a healthy diet  Eat a diet that includes plenty of vegetables, fruits, low-fat dairy products, and lean protein. Do not eat a lot of foods that are high in solid fats, added sugars, or sodium. Maintain a healthy weight Body mass index (BMI) is a measurement that can be used to identify possible weight problems. It estimates body fat based on height and weight. Your health care provider can help determine your BMI and help you achieve or maintain a healthy weight. Get regular exercise Get regular exercise. This is one of the most important things you can do for your health. Most adults should: Exercise for at least 150 minutes each week. The  exercise should increase your heart rate and make you sweat (moderate-intensity exercise). Do strengthening exercises at least twice a week. This is in addition to the moderate-intensity exercise. Spend less time sitting. Even light physical activity can be beneficial. Watch cholesterol and blood lipids Have your blood tested for lipids and cholesterol at 79 years of age, then have this test every 5 years. You may need to have your cholesterol levels checked more often if: Your lipid or cholesterol levels are high. You are older than 79 years of age. You are at high risk for heart disease. What should I know about cancer screening? Many types of cancers can be detected early and may often be prevented. Depending on your health history and family history, you may need to have cancer screening at various ages. This may include screening for: Colorectal cancer. Prostate cancer. Skin cancer. Lung cancer. What should I know about heart disease, diabetes, and high blood pressure? Blood pressure and heart disease High blood pressure causes heart disease and increases the risk of stroke. This is more likely to develop in people who have high blood pressure readings, are of African descent, or are overweight. Talk with your health care provider about your target blood pressure readings. Have your blood pressure checked: Every 3-5 years if you are 43-56 years of age. Every year if you are 30 years old or older. If you are between the ages of 30 and 27 and are a current or former smoker, ask your health care provider if you should have a one-time screening for abdominal aortic aneurysm (AAA). Diabetes Have regular diabetes screenings. This checks your fasting blood sugar level. Have the screening done: Once every three years after age 32 if you are at a normal weight and have a low risk for diabetes. More often and at a younger age if you are overweight or have a high risk for diabetes. What should I  know about preventing infection?  Hepatitis B If you have a higher risk for hepatitis B, you should be screened for this virus. Talk with your health care provider to find out if you are at risk for hepatitis B infection. Hepatitis C Blood testing is recommended for: Everyone born from [birth year] through 1964. Anyone with known risk factors for hepatitis C. Sexually transmitted infections (STIs) You should be screened each year for STIs, including gonorrhea and chlamydia, if: You are sexually active and are younger than 79 years of age. You are older than 79 years of age and your health care provider tells you that you are at risk for this type of infection. Your sexual activity has changed since you were last screened, and you are at increased risk for chlamydia or gonorrhea. Ask your health care provider if you are at risk. Ask your health care provider about whether you are at high risk for HIV. Your health care provider may recommend a prescription medicine to help prevent HIV infection. If you choose to take medicine to prevent HIV, you should first get tested for HIV. You should then be tested every 3 months for as long as you are taking the medicine. Follow these instructions at home: Lifestyle Do not use any products that contain nicotine or tobacco, such as cigarettes, e-cigarettes, and chewing tobacco. If you need help quitting, ask your health care provider. Do not use street drugs. Do not share needles. Ask your health care provider for help if you need support or information about quitting drugs. Alcohol use Do not drink alcohol if your health care provider tells you not to drink. If you drink alcohol: Limit how much you have to 0-2 drinks a day. Be aware of how much alcohol is in your drink. In the [place] one drink equals one 12 oz bottle of beer (355 mL), one 5 oz glass of wine (148 mL), or one 1 oz glass of hard liquor (44 mL). General instructions Schedule regular health, dental,  and eye exams. Stay current with your vaccines. Tell your health care provider if: You often feel depressed. You have ever been abused or do not feel safe at home. Summary Adopting a healthy lifestyle and getting preventive care are important in promoting health and wellness. Follow your health care provider's instructions about healthy diet, exercising, and getting tested or screened for diseases. Follow your health care provider's instructions on monitoring your cholesterol and blood pressure. This information is not intended to replace advice given to you by your health care provider. Make sure you discuss any questions you have with your health care provider. Document Revised: 04/09/2020 Document Reviewed: 01/23/2018 Elsevier Patient Education  2022 [employer]     Edwina BarthMiguel Zaidy Absher, MD [facility]

## 2021-01-11 DIAGNOSIS — M545 Low back pain, unspecified: Secondary | ICD-10-CM | POA: Diagnosis not present

## 2021-01-11 DIAGNOSIS — Z6828 Body mass index (BMI) 28.0-28.9, adult: Secondary | ICD-10-CM | POA: Diagnosis not present

## 2021-01-11 DIAGNOSIS — M4716 Other spondylosis with myelopathy, lumbar region: Secondary | ICD-10-CM | POA: Diagnosis not present

## 2021-01-11 DIAGNOSIS — I10 Essential (primary) hypertension: Secondary | ICD-10-CM | POA: Diagnosis not present

## 2021-01-27 DIAGNOSIS — M5134 Other intervertebral disc degeneration, thoracic region: Secondary | ICD-10-CM | POA: Diagnosis not present

## 2021-01-27 DIAGNOSIS — M9983 Other biomechanical lesions of lumbar region: Secondary | ICD-10-CM | POA: Diagnosis not present

## 2021-01-27 DIAGNOSIS — M5105 Intervertebral disc disorders with myelopathy, thoracolumbar region: Secondary | ICD-10-CM | POA: Diagnosis not present

## 2021-01-27 DIAGNOSIS — M48061 Spinal stenosis, lumbar region without neurogenic claudication: Secondary | ICD-10-CM | POA: Diagnosis not present

## 2021-01-27 DIAGNOSIS — M4716 Other spondylosis with myelopathy, lumbar region: Secondary | ICD-10-CM | POA: Diagnosis not present

## 2021-01-27 DIAGNOSIS — M5106 Intervertebral disc disorders with myelopathy, lumbar region: Secondary | ICD-10-CM | POA: Diagnosis not present

## 2021-01-27 DIAGNOSIS — M47817 Spondylosis without myelopathy or radiculopathy, lumbosacral region: Secondary | ICD-10-CM | POA: Diagnosis not present

## 2021-01-27 DIAGNOSIS — M4715 Other spondylosis with myelopathy, thoracolumbar region: Secondary | ICD-10-CM | POA: Diagnosis not present

## 2021-01-27 DIAGNOSIS — M5137 Other intervertebral disc degeneration, lumbosacral region: Secondary | ICD-10-CM | POA: Diagnosis not present

## 2021-02-10 DIAGNOSIS — M5116 Intervertebral disc disorders with radiculopathy, lumbar region: Secondary | ICD-10-CM | POA: Diagnosis not present

## 2021-02-10 DIAGNOSIS — M4716 Other spondylosis with myelopathy, lumbar region: Secondary | ICD-10-CM | POA: Diagnosis not present

## 2021-08-24 ENCOUNTER — Telehealth: Payer: Self-pay | Admitting: Emergency Medicine

## 2021-08-24 NOTE — Telephone Encounter (Signed)
Ok. Thank you.

## 2021-08-24 NOTE — Telephone Encounter (Signed)
Patient declined AWV on 08/24/21

## 2021-10-20 ENCOUNTER — Encounter: Payer: Self-pay | Admitting: Emergency Medicine

## 2021-10-20 ENCOUNTER — Ambulatory Visit (INDEPENDENT_AMBULATORY_CARE_PROVIDER_SITE_OTHER): Payer: Medicare PPO | Admitting: Emergency Medicine

## 2021-10-20 ENCOUNTER — Other Ambulatory Visit: Payer: Self-pay | Admitting: Emergency Medicine

## 2021-10-20 VITALS — BP 120/68 | HR 60 | Temp 97.7°F | Ht 72.0 in | Wt 202.2 lb

## 2021-10-20 DIAGNOSIS — Z0000 Encounter for general adult medical examination without abnormal findings: Secondary | ICD-10-CM | POA: Diagnosis not present

## 2021-10-20 DIAGNOSIS — Z130 Encounter for screening for diseases of the blood and blood-forming organs and certain disorders involving the immune mechanism: Secondary | ICD-10-CM

## 2021-10-20 DIAGNOSIS — Z13220 Encounter for screening for lipoid disorders: Secondary | ICD-10-CM

## 2021-10-20 DIAGNOSIS — Z1329 Encounter for screening for other suspected endocrine disorder: Secondary | ICD-10-CM

## 2021-10-20 DIAGNOSIS — E785 Hyperlipidemia, unspecified: Secondary | ICD-10-CM

## 2021-10-20 DIAGNOSIS — Z13228 Encounter for screening for other metabolic disorders: Secondary | ICD-10-CM

## 2021-10-20 DIAGNOSIS — R7303 Prediabetes: Secondary | ICD-10-CM | POA: Diagnosis not present

## 2021-10-20 DIAGNOSIS — I10 Essential (primary) hypertension: Secondary | ICD-10-CM | POA: Diagnosis not present

## 2021-10-20 LAB — LIPID PANEL
Cholesterol: 280 mg/dL — ABNORMAL HIGH (ref 0–200)
HDL: 38 mg/dL — ABNORMAL LOW (ref >39.00)
NonHDL: 242.4
Total CHOL/HDL Ratio: 7
Triglycerides: 220 mg/dL — ABNORMAL HIGH (ref 0.0–149.0)
VLDL: 44 mg/dL — ABNORMAL HIGH (ref 0.0–40.0)

## 2021-10-20 LAB — CBC WITH DIFFERENTIAL/PLATELET
Basophils Absolute: 0.1 10*3/uL (ref 0.0–0.1)
Basophils Relative: 1 % (ref 0.0–3.0)
Eosinophils Absolute: 0.5 10*3/uL (ref 0.0–0.7)
Eosinophils Relative: 7.8 % — ABNORMAL HIGH (ref 0.0–5.0)
HCT: 42.4 % (ref 39.0–52.0)
Hemoglobin: 14.6 g/dL (ref 13.0–17.0)
Lymphocytes Relative: 33.3 % (ref 12.0–46.0)
Lymphs Abs: 1.9 10*3/uL (ref 0.7–4.0)
MCHC: 34.4 g/dL (ref 30.0–36.0)
MCV: 88.7 fl (ref 78.0–100.0)
Monocytes Absolute: 0.4 10*3/uL (ref 0.1–1.0)
Monocytes Relative: 6.9 % (ref 3.0–12.0)
Neutro Abs: 2.9 10*3/uL (ref 1.4–7.7)
Neutrophils Relative %: 51 % (ref 43.0–77.0)
Platelets: 249 10*3/uL (ref 150.0–400.0)
RBC: 4.78 Mil/uL (ref 4.22–5.81)
RDW: 13.9 % (ref 11.5–15.5)
WBC: 5.8 10*3/uL (ref 4.0–10.5)

## 2021-10-20 LAB — COMPREHENSIVE METABOLIC PANEL
ALT: 18 U/L (ref 0–53)
AST: 17 U/L (ref 0–37)
Albumin: 4.3 g/dL (ref 3.5–5.2)
Alkaline Phosphatase: 59 U/L (ref 39–117)
BUN: 16 mg/dL (ref 6–23)
CO2: 24 mEq/L (ref 19–32)
Calcium: 10 mg/dL (ref 8.4–10.5)
Chloride: 105 mEq/L (ref 96–112)
Creatinine, Ser: 0.99 mg/dL (ref 0.40–1.50)
GFR: 72.18 mL/min (ref >60.00)
Glucose, Bld: 101 mg/dL — ABNORMAL HIGH (ref 70–99)
Potassium: 3.9 mEq/L (ref 3.5–5.1)
Sodium: 138 mEq/L (ref 135–145)
Total Bilirubin: 0.6 mg/dL (ref 0.2–1.2)
Total Protein: 7.6 g/dL (ref 6.0–8.3)

## 2021-10-20 LAB — HEMOGLOBIN A1C: Hgb A1c MFr Bld: 6.2 % (ref 4.6–6.5)

## 2021-10-20 LAB — LDL CHOLESTEROL, DIRECT: Direct LDL: 203 mg/dL

## 2021-10-20 MED ORDER — FOSINOPRIL SODIUM 40 MG PO TABS: ORAL_TABLET | ORAL | 3 refills | Status: DC

## 2021-10-20 NOTE — Progress Notes (Signed)
Eric Hahn 81 y.o.   Chief Complaint  Patient presents with   Annual Exam    No concerns    HISTORY OF PRESENT ILLNESS: This is a 81 y.o. male here for annual exam. No complaints or medical concerns today. History of hypertension on Monopril.  Needs medication refill.  HPI   Prior to Admission medications   Medication Sig Start Date End Date Taking? Authorizing Provider  aspirin 81 MG tablet Take 81 mg by mouth daily.      [provider]  ezetimibe (ZETIA) 10 MG tablet Take 1 tablet (10 mg total) by mouth daily. 10/19/20   Ranbir Chew, Eilleen Kempf, MD  Flaxseed, Linseed, (FLAX SEED OIL) 1000 MG CAPS Take 1 capsule by mouth daily. daily    [provider]  fosinopril (MONOPRIL) 40 MG tablet TAKE 1 TABLET(40 MG) BY MOUTH DAILY 10/19/20   Georgina Quint, MD  KRILL OIL PO Take 1 tablet by mouth daily.     [provider]  Red Yeast Rice 600 MG TABS Take 600 mg by mouth daily. Daily    [provider]    Allergies  Allergen Reactions   Amoxicillin Hives and Rash   Sulfa Antibiotics Other (See Comments)    Unknown   Statins Other (See Comments)    Pt is intolerant-muscle aches and weakness    Patient Active Problem List   Diagnosis Date Noted   Thyroid nodule 10/21/2014   Asymptomatic stenosis of right carotid artery 09/15/2014   Other and unspecified hyperlipidemia 06/10/2012   Abnormal ECG 06/06/2010   Hypertension 06/06/2010    Past Medical History:  Diagnosis Date   Arthritis    Carotid artery occlusion    Full dentures    History of asthma    pt. states as a child   History of bronchitis    History of kidney stones    History of pneumonia    HOH (hard of hearing)    Hyperlipidemia    Hypertension    Obesity    PONV (postoperative nausea and vomiting)    pt. states that was sick with first surgery but 2nd surgery used patch behind ears and had no issues    Past Surgical History:  Procedure Laterality Date    CAROTID ENDARTERECTOMY Right 09-15-14   ENDARTERECTOMY Right 09/15/2014   Procedure: RIGHT CAROTID ENDARTERECTOMY WITH HEMASHIELD PATCH ANGIOPLASTY ;  Surgeon: Sherren Kerns, MD;  Location: [facility] OR;  Service: Vascular;  Laterality: Right;   NASAL SINUS SURGERY     SPINE SURGERY     C-7 disk surgery  by Dr. Darreld Mclean     Social History   Socioeconomic History   Marital status: Married    Spouse name: Not on file   Number of children: Not on file   Years of education: Not on file   Highest education level: Not on file  Occupational History   Not on file  Tobacco Use   Smoking status: Former    Types: Cigarettes    Quit date: 06/06/1975    Years since quitting: 46.4   Smokeless tobacco: Never  Substance and Sexual Activity   Alcohol use: No   Drug use: No   Sexual activity: Not on file  Other Topics Concern   Not on file  Social History Narrative   Married   Education: High School   Exercise: Yes   Social Determinants of Health   Financial Resource Strain: Not on file  Food Insecurity:  Not on file  Transportation Needs: Not on file  Physical Activity: Not on file  Stress: Not on file  Social Connections: Not on file  Intimate Partner Violence: Not on file    Family History  Problem Relation Age of Onset   Hyperlipidemia Father    Arthritis Mother    Arthritis Sister      Review of Systems  Constitutional: Negative.  Negative for chills and fever.  HENT: Negative.  Negative for congestion and sore throat.   Respiratory: Negative.  Negative for cough and shortness of breath.   Cardiovascular: Negative.  Negative for chest pain and palpitations.  Gastrointestinal:  Negative for abdominal pain, diarrhea, nausea and vomiting.  Genitourinary: Negative.   Skin: Negative.  Negative for rash.  Neurological:  Negative for dizziness and headaches.  All other systems reviewed and are negative.  Today's Vitals   10/20/21 0827  BP: 120/68  Pulse: 60  Temp: 97.7 F  (36.5 C)  TempSrc: Oral  SpO2: 94%  Weight: 202 lb 4 oz (91.7 kg)  Height: 6' (1.829 m)   Body mass index is 27.43 kg/m.   Physical Exam Vitals reviewed.  Constitutional:      Appearance: Normal appearance.  HENT:     Head: Normocephalic.     Right Ear: Tympanic membrane, ear canal and external ear normal.     Left Ear: Tympanic membrane, ear canal and external ear normal.     Mouth/Throat:     Mouth: Mucous membranes are moist.     Pharynx: Oropharynx is clear.  Eyes:     Extraocular Movements: Extraocular movements intact.     Conjunctiva/sclera: Conjunctivae normal.     Pupils: Pupils are equal, round, and reactive to light.  Cardiovascular:     Rate and Rhythm: Normal rate and regular rhythm.     Pulses: Normal pulses.     Heart sounds: Normal heart sounds.  Pulmonary:     Effort: Pulmonary effort is normal.     Breath sounds: Normal breath sounds.  Abdominal:     Palpations: Abdomen is soft. There is no mass.     Tenderness: There is no abdominal tenderness.  Musculoskeletal:        General: Normal range of motion.     Cervical back: No tenderness.     Right lower leg: No edema.     Left lower leg: No edema.  Lymphadenopathy:     Cervical: No cervical adenopathy.  Skin:    General: Skin is warm and dry.     Capillary Refill: Capillary refill takes less than 2 seconds.  Neurological:     General: No focal deficit present.     Mental Status: He is alert and oriented to person, place, and time.  Psychiatric:        Mood and Affect: Mood normal.        Behavior: Behavior normal.      ASSESSMENT & PLAN: Problem List Items Addressed This Visit       Cardiovascular and Mediastinum   Hypertension   Relevant Medications   fosinopril (MONOPRIL) 40 MG tablet   Other Relevant Orders   CBC with Differential   Comprehensive metabolic panel   Other Visit Diagnoses     Routine general medical examination at a health care facility    -  Primary    Dyslipidemia       Relevant Orders   CBC with Differential   Hemoglobin A1c   Lipid panel  Prediabetes       Relevant Orders   CBC with Differential   Hemoglobin A1c   Screening for deficiency anemia       Relevant Orders   CBC with Differential   Screening for lipoid disorders       Relevant Orders   Lipid panel   Screening for endocrine, metabolic and immunity disorder       Relevant Orders   Hemoglobin A1c   Essential hypertension       Relevant Medications   fosinopril (MONOPRIL) 40 MG tablet      Modifiable risk factors discussed with patient. Anticipatory guidance according to age provided. The following topics were also discussed: Social Determinants of Health Smoking.  Non-smoker Diet and nutrition Benefits of exercise Cancer screening  Vaccinations recommendations Cardiovascular risk assessment Mental health including depression and anxiety Fall and accident prevention  Patient Instructions  Health Maintenance, Male Adopting a healthy lifestyle and getting preventive care are important in promoting health and wellness. Ask your health care provider about: The right schedule for you to have regular tests and exams. Things you can do on your own to prevent diseases and keep yourself healthy. What should I know about diet, weight, and exercise? Eat a healthy diet  Eat a diet that includes plenty of vegetables, fruits, low-fat dairy products, and lean protein. Do not eat a lot of foods that are high in solid fats, added sugars, or sodium. Maintain a healthy weight Body mass index (BMI) is a measurement that can be used to identify possible weight problems. It estimates body fat based on height and weight. Your health care provider can help determine your BMI and help you achieve or maintain a healthy weight. Get regular exercise Get regular exercise. This is one of the most important things you can do for your health. Most adults should: Exercise for at least  150 minutes each week. The exercise should increase your heart rate and make you sweat (moderate-intensity exercise). Do strengthening exercises at least twice a week. This is in addition to the moderate-intensity exercise. Spend less time sitting. Even light physical activity can be beneficial. Watch cholesterol and blood lipids Have your blood tested for lipids and cholesterol at 80 years of age, then have this test every 5 years. You may need to have your cholesterol levels checked more often if: Your lipid or cholesterol levels are high. You are older than 80 years of age. You are at high risk for heart disease. What should I know about cancer screening? Many types of cancers can be detected early and may often be prevented. Depending on your health history and family history, you may need to have cancer screening at various ages. This may include screening for: Colorectal cancer. Prostate cancer. Skin cancer. Lung cancer. What should I know about heart disease, diabetes, and high blood pressure? Blood pressure and heart disease High blood pressure causes heart disease and increases the risk of stroke. This is more likely to develop in people who have high blood pressure readings or are overweight. Talk with your health care provider about your target blood pressure readings. Have your blood pressure checked: Every 3-5 years if you are 38-27 years of age. Every year if you are 96 years old or older. If you are between the ages of 55 and 85 and are a current or former smoker, ask your health care provider if you should have a one-time screening for abdominal aortic aneurysm (AAA). Diabetes Have regular diabetes screenings.  This checks your fasting blood sugar level. Have the screening done: Once every three years after age 83 if you are at a normal weight and have a low risk for diabetes. More often and at a younger age if you are overweight or have a high risk for diabetes. What should  I know about preventing infection? Hepatitis B If you have a higher risk for hepatitis B, you should be screened for this virus. Talk with your health care provider to find out if you are at risk for hepatitis B infection. Hepatitis C Blood testing is recommended for: Everyone born from [birth year] through 1964. Anyone with known risk factors for hepatitis C. Sexually transmitted infections (STIs) You should be screened each year for STIs, including gonorrhea and chlamydia, if: You are sexually active and are younger than 80 years of age. You are older than 80 years of age and your health care provider tells you that you are at risk for this type of infection. Your sexual activity has changed since you were last screened, and you are at increased risk for chlamydia or gonorrhea. Ask your health care provider if you are at risk. Ask your health care provider about whether you are at high risk for HIV. Your health care provider may recommend a prescription medicine to help prevent HIV infection. If you choose to take medicine to prevent HIV, you should first get tested for HIV. You should then be tested every 3 months for as long as you are taking the medicine. Follow these instructions at home: Alcohol use Do not drink alcohol if your health care provider tells you not to drink. If you drink alcohol: Limit how much you have to 0-2 drinks a day. Know how much alcohol is in your drink. In the [place] one drink equals one 12 oz bottle of beer (355 mL), one 5 oz glass of wine (148 mL), or one 1 oz glass of hard liquor (44 mL). Lifestyle Do not use any products that contain nicotine or tobacco. These products include cigarettes, chewing tobacco, and vaping devices, such as e-cigarettes. If you need help quitting, ask your health care provider. Do not use street drugs. Do not share needles. Ask your health care provider for help if you need support or information about quitting drugs. General  instructions Schedule regular health, dental, and eye exams. Stay current with your vaccines. Tell your health care provider if: You often feel depressed. You have ever been abused or do not feel safe at home. Summary Adopting a healthy lifestyle and getting preventive care are important in promoting health and wellness. Follow your health care provider's instructions about healthy diet, exercising, and getting tested or screened for diseases. Follow your health care provider's instructions on monitoring your cholesterol and blood pressure. This information is not intended to replace advice given to you by your health care provider. Make sure you discuss any questions you have with your health care provider. Document Revised: 06/21/2020 Document Reviewed: 06/21/2020 Elsevier Patient Education  2023 Elsevier Inc.     Edwina Barth, MD [facility]

## 2021-10-20 NOTE — Patient Instructions (Signed)
Health Maintenance, Male Adopting a healthy lifestyle and getting preventive care are important in promoting health and wellness. Ask your health care provider about: The right schedule for you to have regular tests and exams. Things you can do on your own to prevent diseases and keep yourself healthy. What should I know about diet, weight, and exercise? Eat a healthy diet  Eat a diet that includes plenty of vegetables, fruits, low-fat dairy products, and lean protein. Do not eat a lot of foods that are high in solid fats, added sugars, or sodium. Maintain a healthy weight Body mass index (BMI) is a measurement that can be used to identify possible weight problems. It estimates body fat based on height and weight. Your health care provider can help determine your BMI and help you achieve or maintain a healthy weight. Get regular exercise Get regular exercise. This is one of the most important things you can do for your health. Most adults should: Exercise for at least 150 minutes each week. The exercise should increase your heart rate and make you sweat (moderate-intensity exercise). Do strengthening exercises at least twice a week. This is in addition to the moderate-intensity exercise. Spend less time sitting. Even light physical activity can be beneficial. Watch cholesterol and blood lipids Have your blood tested for lipids and cholesterol at 80 years of age, then have this test every 5 years. You may need to have your cholesterol levels checked more often if: Your lipid or cholesterol levels are high. You are older than 80 years of age. You are at high risk for heart disease. What should I know about cancer screening? Many types of cancers can be detected early and may often be prevented. Depending on your health history and family history, you may need to have cancer screening at various ages. This may include screening for: Colorectal cancer. Prostate cancer. Skin cancer. Lung  cancer. What should I know about heart disease, diabetes, and high blood pressure? Blood pressure and heart disease High blood pressure causes heart disease and increases the risk of stroke. This is more likely to develop in people who have high blood pressure readings or are overweight. Talk with your health care provider about your target blood pressure readings. Have your blood pressure checked: Every 3-5 years if you are 18-39 years of age. Every year if you are 40 years old or older. If you are between the ages of 65 and 75 and are a current or former smoker, ask your health care provider if you should have a one-time screening for abdominal aortic aneurysm (AAA). Diabetes Have regular diabetes screenings. This checks your fasting blood sugar level. Have the screening done: Once every three years after age 45 if you are at a normal weight and have a low risk for diabetes. More often and at a younger age if you are overweight or have a high risk for diabetes. What should I know about preventing infection? Hepatitis B If you have a higher risk for hepatitis B, you should be screened for this virus. Talk with your health care provider to find out if you are at risk for hepatitis B infection. Hepatitis C Blood testing is recommended for: Everyone born from 1945 through 1965. Anyone with known risk factors for hepatitis C. Sexually transmitted infections (STIs) You should be screened each year for STIs, including gonorrhea and chlamydia, if: You are sexually active and are younger than 80 years of age. You are older than 80 years of age and your   health care provider tells you that you are at risk for this type of infection. Your sexual activity has changed since you were last screened, and you are at increased risk for chlamydia or gonorrhea. Ask your health care provider if you are at risk. Ask your health care provider about whether you are at high risk for HIV. Your health care provider  may recommend a prescription medicine to help prevent HIV infection. If you choose to take medicine to prevent HIV, you should first get tested for HIV. You should then be tested every 3 months for as long as you are taking the medicine. Follow these instructions at home: Alcohol use Do not drink alcohol if your health care provider tells you not to drink. If you drink alcohol: Limit how much you have to 0-2 drinks a day. Know how much alcohol is in your drink. In the [place] one drink equals one 12 oz bottle of beer (355 mL), one 5 oz glass of wine (148 mL), or one 1 oz glass of hard liquor (44 mL). Lifestyle Do not use any products that contain nicotine or tobacco. These products include cigarettes, chewing tobacco, and vaping devices, such as e-cigarettes. If you need help quitting, ask your health care provider. Do not use street drugs. Do not share needles. Ask your health care provider for help if you need support or information about quitting drugs. General instructions Schedule regular health, dental, and eye exams. Stay current with your vaccines. Tell your health care provider if: You often feel depressed. You have ever been abused or do not feel safe at home. Summary Adopting a healthy lifestyle and getting preventive care are important in promoting health and wellness. Follow your health care provider's instructions about healthy diet, exercising, and getting tested or screened for diseases. Follow your health care provider's instructions on monitoring your cholesterol and blood pressure. This information is not intended to replace advice given to you by your health care provider. Make sure you discuss any questions you have with your health care provider. Document Revised: 06/21/2020 Document Reviewed: 06/21/2020 Elsevier Patient Education  2023 [employer] Inc.  

## 2021-10-21 NOTE — Telephone Encounter (Signed)
No concerns.  Eosinophils are most commonly elevated by allergic conditions.  Nothing to worry about.  Thanks.

## 2022-10-25 ENCOUNTER — Ambulatory Visit (INDEPENDENT_AMBULATORY_CARE_PROVIDER_SITE_OTHER): Payer: Medicare PPO | Admitting: Emergency Medicine

## 2022-10-25 ENCOUNTER — Encounter: Payer: Self-pay | Admitting: Emergency Medicine

## 2022-10-25 VITALS — BP 124/66 | HR 67 | Temp 97.7°F | Ht 72.0 in | Wt 201.4 lb

## 2022-10-25 DIAGNOSIS — Z1329 Encounter for screening for other suspected endocrine disorder: Secondary | ICD-10-CM | POA: Diagnosis not present

## 2022-10-25 DIAGNOSIS — Z130 Encounter for screening for diseases of the blood and blood-forming organs and certain disorders involving the immune mechanism: Secondary | ICD-10-CM | POA: Diagnosis not present

## 2022-10-25 DIAGNOSIS — Z13220 Encounter for screening for lipoid disorders: Secondary | ICD-10-CM | POA: Diagnosis not present

## 2022-10-25 DIAGNOSIS — I10 Essential (primary) hypertension: Secondary | ICD-10-CM

## 2022-10-25 DIAGNOSIS — Z0000 Encounter for general adult medical examination without abnormal findings: Secondary | ICD-10-CM | POA: Diagnosis not present

## 2022-10-25 DIAGNOSIS — Z13228 Encounter for screening for other metabolic disorders: Secondary | ICD-10-CM

## 2022-10-25 LAB — LIPID PANEL
Cholesterol: 303 mg/dL — ABNORMAL HIGH (ref 0–200)
HDL: 36.9 mg/dL — ABNORMAL LOW (ref >39.00)
LDL Cholesterol: 226 mg/dL — ABNORMAL HIGH (ref 0–99)
NonHDL: 266.27
Total CHOL/HDL Ratio: 8
Triglycerides: 203 mg/dL — ABNORMAL HIGH (ref 0.0–149.0)
VLDL: 40.6 mg/dL — ABNORMAL HIGH (ref 0.0–40.0)

## 2022-10-25 LAB — CBC WITH DIFFERENTIAL/PLATELET
Basophils Absolute: 0.1 10*3/uL (ref 0.0–0.1)
Basophils Relative: 1.1 % (ref 0.0–3.0)
Eosinophils Absolute: 0.6 10*3/uL (ref 0.0–0.7)
Eosinophils Relative: 9.4 % — ABNORMAL HIGH (ref 0.0–5.0)
HCT: 44.9 % (ref 39.0–52.0)
Hemoglobin: 15 g/dL (ref 13.0–17.0)
Lymphocytes Relative: 35.9 % (ref 12.0–46.0)
Lymphs Abs: 2.1 10*3/uL (ref 0.7–4.0)
MCHC: 33.4 g/dL (ref 30.0–36.0)
MCV: 88.8 fl (ref 78.0–100.0)
Monocytes Absolute: 0.4 10*3/uL (ref 0.1–1.0)
Monocytes Relative: 6.4 % (ref 3.0–12.0)
Neutro Abs: 2.8 10*3/uL (ref 1.4–7.7)
Neutrophils Relative %: 47.2 % (ref 43.0–77.0)
Platelets: 264 10*3/uL (ref 150.0–400.0)
RBC: 5.06 Mil/uL (ref 4.22–5.81)
RDW: 13.7 % (ref 11.5–15.5)
WBC: 5.9 10*3/uL (ref 4.0–10.5)

## 2022-10-25 LAB — COMPREHENSIVE METABOLIC PANEL
ALT: 15 U/L (ref 0–53)
AST: 14 U/L (ref 0–37)
Albumin: 4.3 g/dL (ref 3.5–5.2)
Alkaline Phosphatase: 61 U/L (ref 39–117)
BUN: 19 mg/dL (ref 6–23)
CO2: 23 meq/L (ref 19–32)
Calcium: 9.6 mg/dL (ref 8.4–10.5)
Chloride: 105 meq/L (ref 96–112)
Creatinine, Ser: 1.04 mg/dL (ref 0.40–1.50)
GFR: 67.55 mL/min (ref >60.00)
Glucose, Bld: 113 mg/dL — ABNORMAL HIGH (ref 70–99)
Potassium: 3.9 meq/L (ref 3.5–5.1)
Sodium: 137 meq/L (ref 135–145)
Total Bilirubin: 0.7 mg/dL (ref 0.2–1.2)
Total Protein: 7.5 g/dL (ref 6.0–8.3)

## 2022-10-25 LAB — HEMOGLOBIN A1C: Hgb A1c MFr Bld: 6.3 % (ref 4.6–6.5)

## 2022-10-25 MED ORDER — FOSINOPRIL SODIUM 40 MG PO TABS: ORAL_TABLET | ORAL | 3 refills | Status: DC

## 2022-10-25 NOTE — Assessment & Plan Note (Signed)
Well-controlled hypertension Continue Monopril 40 mg daily BP Readings from Last 3 Encounters:  10/25/22 124/66  10/20/21 120/68  10/19/20 138/80

## 2022-10-25 NOTE — Patient Instructions (Signed)
Health Maintenance, Male Adopting a healthy lifestyle and getting preventive care are important in promoting health and wellness. Ask your health care provider about: The right schedule for you to have regular tests and exams. Things you can do on your own to prevent diseases and keep yourself healthy. What should I know about diet, weight, and exercise? Eat a healthy diet  Eat a diet that includes plenty of vegetables, fruits, low-fat dairy products, and lean protein. Do not eat a lot of foods that are high in solid fats, added sugars, or sodium. Maintain a healthy weight Body mass index (BMI) is a measurement that can be used to identify possible weight problems. It estimates body fat based on height and weight. Your health care provider can help determine your BMI and help you achieve or maintain a healthy weight. Get regular exercise Get regular exercise. This is one of the most important things you can do for your health. Most adults should: Exercise for at least 150 minutes each week. The exercise should increase your heart rate and make you sweat (moderate-intensity exercise). Do strengthening exercises at least twice a week. This is in addition to the moderate-intensity exercise. Spend less time sitting. Even light physical activity can be beneficial. Watch cholesterol and blood lipids Have your blood tested for lipids and cholesterol at 81 years of age, then have this test every 5 years. You may need to have your cholesterol levels checked more often if: Your lipid or cholesterol levels are high. You are older than 81 years of age. You are at high risk for heart disease. What should I know about cancer screening? Many types of cancers can be detected early and may often be prevented. Depending on your health history and family history, you may need to have cancer screening at various ages. This may include screening for: Colorectal cancer. Prostate cancer. Skin cancer. Lung  cancer. What should I know about heart disease, diabetes, and high blood pressure? Blood pressure and heart disease High blood pressure causes heart disease and increases the risk of stroke. This is more likely to develop in people who have high blood pressure readings or are overweight. Talk with your health care provider about your target blood pressure readings. Have your blood pressure checked: Every 3-5 years if you are 18-39 years of age. Every year if you are 40 years old or older. If you are between the ages of 65 and 75 and are a current or former smoker, ask your health care provider if you should have a one-time screening for abdominal aortic aneurysm (AAA). Diabetes Have regular diabetes screenings. This checks your fasting blood sugar level. Have the screening done: Once every three years after age 45 if you are at a normal weight and have a low risk for diabetes. More often and at a younger age if you are overweight or have a high risk for diabetes. What should I know about preventing infection? Hepatitis B If you have a higher risk for hepatitis B, you should be screened for this virus. Talk with your health care provider to find out if you are at risk for hepatitis B infection. Hepatitis C Blood testing is recommended for: Everyone born from 1945 through 1965. Anyone with known risk factors for hepatitis C. Sexually transmitted infections (STIs) You should be screened each year for STIs, including gonorrhea and chlamydia, if: You are sexually active and are younger than 81 years of age. You are older than 81 years of age and your   health care provider tells you that you are at risk for this type of infection. Your sexual activity has changed since you were last screened, and you are at increased risk for chlamydia or gonorrhea. Ask your health care provider if you are at risk. Ask your health care provider about whether you are at high risk for HIV. Your health care provider  may recommend a prescription medicine to help prevent HIV infection. If you choose to take medicine to prevent HIV, you should first get tested for HIV. You should then be tested every 3 months for as long as you are taking the medicine. Follow these instructions at home: Alcohol use Do not drink alcohol if your health care provider tells you not to drink. If you drink alcohol: Limit how much you have to 0-2 drinks a day. Know how much alcohol is in your drink. In the [place] one drink equals one 12 oz bottle of beer (355 mL), one 5 oz glass of wine (148 mL), or one 1 oz glass of hard liquor (44 mL). Lifestyle Do not use any products that contain nicotine or tobacco. These products include cigarettes, chewing tobacco, and vaping devices, such as e-cigarettes. If you need help quitting, ask your health care provider. Do not use street drugs. Do not share needles. Ask your health care provider for help if you need support or information about quitting drugs. General instructions Schedule regular health, dental, and eye exams. Stay current with your vaccines. Tell your health care provider if: You often feel depressed. You have ever been abused or do not feel safe at home. Summary Adopting a healthy lifestyle and getting preventive care are important in promoting health and wellness. Follow your health care provider's instructions about healthy diet, exercising, and getting tested or screened for diseases. Follow your health care provider's instructions on monitoring your cholesterol and blood pressure. This information is not intended to replace advice given to you by your health care provider. Make sure you discuss any questions you have with your health care provider. Document Revised: 06/21/2020 Document Reviewed: 06/21/2020 Elsevier Patient Education  2024 Elsevier Inc.  

## 2022-10-25 NOTE — Progress Notes (Signed)
Eric Hahn 82 y.o.   Chief Complaint  Patient presents with   Annual Exam    No concerns     HISTORY OF PRESENT ILLNESS: This is a 82 y.o. male here for annual exam. Has no complaints or medical concerns. History of hypertension on Monopril.  HPI   Prior to Admission medications   Medication Sig Start Date End Date Taking? Authorizing Provider  aspirin 81 MG tablet Take 81 mg by mouth daily.     Yes [provider]  Flaxseed, Linseed, (FLAX SEED OIL) 1000 MG CAPS Take 1 capsule by mouth daily. daily   Yes [provider]  fosinopril (MONOPRIL) 40 MG tablet TAKE 1 TABLET(40 MG) BY MOUTH DAILY 10/20/21  Yes Kirubel Aja, [place], MD  KRILL OIL PO Take 1 tablet by mouth daily.    Yes [provider]  Red Yeast Rice 600 MG TABS Take 600 mg by mouth daily. Daily   Yes [provider]    Allergies  Allergen Reactions   Amoxicillin Hives and Rash   Sulfa Antibiotics Other (See Comments)    Unknown   Statins Other (See Comments)    Pt is intolerant-muscle aches and weakness    Patient Active Problem List   Diagnosis Date Noted   Thyroid nodule 10/21/2014   Asymptomatic stenosis of right carotid artery 09/15/2014   Other and unspecified hyperlipidemia 06/10/2012   Abnormal ECG 06/06/2010   Hypertension 06/06/2010    Past Medical History:  Diagnosis Date   Arthritis    Carotid artery occlusion    Full dentures    History of asthma    pt. states as a child   History of bronchitis    History of kidney stones    History of pneumonia    HOH (hard of hearing)    Hyperlipidemia    Hypertension    Obesity    PONV (postoperative nausea and vomiting)    pt. states that was sick with first surgery but 2nd surgery used patch behind ears and had no issues    Past Surgical History:  Procedure Laterality Date   CAROTID ENDARTERECTOMY Right 09-15-14   ENDARTERECTOMY Right 09/15/2014   Procedure: RIGHT CAROTID ENDARTERECTOMY WITH HEMASHIELD  PATCH ANGIOPLASTY ;  Surgeon: Sherren Kerns, MD;  Location: [facility] OR;  Service: Vascular;  Laterality: Right;   NASAL SINUS SURGERY     SPINE SURGERY     C-7 disk surgery  by Dr. Darreld Mclean     Social History   Socioeconomic History   Marital status: Married    Spouse name: Not on file   Number of children: Not on file   Years of education: Not on file   Highest education level: GED or equivalent  Occupational History   Not on file  Tobacco Use   Smoking status: Former    Current packs/day: 0.00    Types: Cigarettes    Quit date: 06/06/1975    Years since quitting: 47.4   Smokeless tobacco: Never  Substance and Sexual Activity   Alcohol use: No   Drug use: No   Sexual activity: Not on file  Other Topics Concern   Not on file  Social History Narrative   Married   Education: High School   Exercise: Yes   Social Determinants of Health   Financial Resource Strain: Low Risk  (10/19/2022)   Overall Financial Resource Strain (CARDIA)    Difficulty of Paying Living Expenses: Not hard at all  Food  Insecurity: No Food Insecurity (10/19/2022)   Hunger Vital Sign    Worried About Running Out of Food in the Last Year: Never true    Ran Out of Food in the Last Year: Never true  Transportation Needs: No Transportation Needs (10/19/2022)   PRAPARE - Administrator, Civil Service (Medical): No    Lack of Transportation (Non-Medical): No  Physical Activity: Insufficiently Active (10/19/2022)   Exercise Vital Sign    Days of Exercise per Week: 2 days    Minutes of Exercise per Session: 30 min  Stress: No Stress Concern Present (10/19/2022)   [employer] of Occupational Health - Occupational Stress Questionnaire    Feeling of Stress : Only a little  Social Connections: Moderately Integrated (10/19/2022)   Social Connection and Isolation Panel [NHANES]    Frequency of Communication with Friends and Family: Twice a week    Frequency of Social Gatherings with Friends and  Family: Once a week    Attends Religious Services: 1 to 4 times per year    Active Member of [employer] or Organizations: No    Attends Engineer, structural: Not on file    Marital Status: Married  Intimate Partner Violence: Unknown (05/20/2021)   Received from [employer], [facility]   HITS    Physically Hurt: Not on file    Insult or Talk Down To: Not on file    Threaten Physical Harm: Not on file    Scream or Curse: Not on file    Family History  Problem Relation Age of Onset   Hyperlipidemia Father    Arthritis Mother    Arthritis Sister      Review of Systems  Constitutional: Negative.  Negative for chills and fever.  HENT: Negative.  Negative for congestion and sore throat.   Respiratory: Negative.  Negative for cough and shortness of breath.   Cardiovascular: Negative.  Negative for chest pain and palpitations.  Gastrointestinal:  Negative for abdominal pain, diarrhea, nausea and vomiting.  Genitourinary: Negative.  Negative for dysuria and hematuria.  Skin: Negative.  Negative for rash.  Neurological: Negative.  Negative for dizziness and headaches.  All other systems reviewed and are negative.   Vitals:   10/25/22 0819  BP: 124/66  Pulse: 67  Temp: 97.7 F (36.5 C)  SpO2: 94%    Physical Exam Vitals reviewed.  Constitutional:      Appearance: Normal appearance.  HENT:     Head: Normocephalic.     Right Ear: Tympanic membrane, ear canal and external ear normal.     Left Ear: Tympanic membrane, ear canal and external ear normal.     Mouth/Throat:     Mouth: Mucous membranes are moist.     Pharynx: Oropharynx is clear.  Eyes:     Extraocular Movements: Extraocular movements intact.     Conjunctiva/sclera: Conjunctivae normal.     Pupils: Pupils are equal, round, and reactive to light.  Cardiovascular:     Rate and Rhythm: Normal rate and regular rhythm.     Pulses: Normal pulses.     Heart sounds: Normal heart sounds.  Pulmonary:     Effort:  Pulmonary effort is normal.     Breath sounds: Normal breath sounds.  Abdominal:     Palpations: Abdomen is soft.     Tenderness: There is no abdominal tenderness.  Musculoskeletal:     Cervical back: No tenderness.  Lymphadenopathy:     Cervical: No cervical adenopathy.  Skin:  General: Skin is warm and dry.     Capillary Refill: Capillary refill takes less than 2 seconds.  Neurological:     General: No focal deficit present.     Mental Status: He is alert and oriented to person, place, and time.  Psychiatric:        Mood and Affect: Mood normal.        Behavior: Behavior normal.      ASSESSMENT & PLAN: Problem List Items Addressed This Visit       Cardiovascular and Mediastinum   Hypertension    Well-controlled hypertension Continue Monopril 40 mg daily BP Readings from Last 3 Encounters:  10/25/22 124/66  10/20/21 120/68  10/19/20 138/80         Relevant Medications   fosinopril (MONOPRIL) 40 MG tablet   Other Visit Diagnoses     Routine general medical examination at a health care facility    -  Primary   Relevant Orders   CBC with Differential   Comprehensive metabolic panel   Hemoglobin A1c   Lipid panel   Essential hypertension       Relevant Medications   fosinopril (MONOPRIL) 40 MG tablet   Other Relevant Orders   Comprehensive metabolic panel   Screening for deficiency anemia       Relevant Orders   CBC with Differential   Screening for lipoid disorders       Relevant Orders   Lipid panel   Screening for endocrine, metabolic and immunity disorder       Relevant Orders   Comprehensive metabolic panel   Hemoglobin A1c      Modifiable risk factors discussed with patient. Anticipatory guidance according to age provided. The following topics were also discussed: Social Determinants of Health Smoking.  Non-smoker Diet and nutrition.  Good eating habits Benefits of exercise Cancer screening and review of most recent colonoscopy report  from 2017 Vaccinations review and recommendations Cardiovascular risk assessment Mental health including depression and anxiety Fall and accident prevention  Patient Instructions  Health Maintenance, Male Adopting a healthy lifestyle and getting preventive care are important in promoting health and wellness. Ask your health care provider about: The right schedule for you to have regular tests and exams. Things you can do on your own to prevent diseases and keep yourself healthy. What should I know about diet, weight, and exercise? Eat a healthy diet  Eat a diet that includes plenty of vegetables, fruits, low-fat dairy products, and lean protein. Do not eat a lot of foods that are high in solid fats, added sugars, or sodium. Maintain a healthy weight Body mass index (BMI) is a measurement that can be used to identify possible weight problems. It estimates body fat based on height and weight. Your health care provider can help determine your BMI and help you achieve or maintain a healthy weight. Get regular exercise Get regular exercise. This is one of the most important things you can do for your health. Most adults should: Exercise for at least 150 minutes each week. The exercise should increase your heart rate and make you sweat (moderate-intensity exercise). Do strengthening exercises at least twice a week. This is in addition to the moderate-intensity exercise. Spend less time sitting. Even light physical activity can be beneficial. Watch cholesterol and blood lipids Have your blood tested for lipids and cholesterol at 81 years of age, then have this test every 5 years. You may need to have your cholesterol levels checked more often if: Your  lipid or cholesterol levels are high. You are older than 81 years of age. You are at high risk for heart disease. What should I know about cancer screening? Many types of cancers can be detected early and may often be prevented. Depending on your  health history and family history, you may need to have cancer screening at various ages. This may include screening for: Colorectal cancer. Prostate cancer. Skin cancer. Lung cancer. What should I know about heart disease, diabetes, and high blood pressure? Blood pressure and heart disease High blood pressure causes heart disease and increases the risk of stroke. This is more likely to develop in people who have high blood pressure readings or are overweight. Talk with your health care provider about your target blood pressure readings. Have your blood pressure checked: Every 3-5 years if you are 21-50 years of age. Every year if you are 59 years old or older. If you are between the ages of 94 and 34 and are a current or former smoker, ask your health care provider if you should have a one-time screening for abdominal aortic aneurysm (AAA). Diabetes Have regular diabetes screenings. This checks your fasting blood sugar level. Have the screening done: Once every three years after age 22 if you are at a normal weight and have a low risk for diabetes. More often and at a younger age if you are overweight or have a high risk for diabetes. What should I know about preventing infection? Hepatitis B If you have a higher risk for hepatitis B, you should be screened for this virus. Talk with your health care provider to find out if you are at risk for hepatitis B infection. Hepatitis C Blood testing is recommended for: Everyone born from [birth year] through 1965. Anyone with known risk factors for hepatitis C. Sexually transmitted infections (STIs) You should be screened each year for STIs, including gonorrhea and chlamydia, if: You are sexually active and are younger than 81 years of age. You are older than 81 years of age and your health care provider tells you that you are at risk for this type of infection. Your sexual activity has changed since you were last screened, and you are at increased risk  for chlamydia or gonorrhea. Ask your health care provider if you are at risk. Ask your health care provider about whether you are at high risk for HIV. Your health care provider may recommend a prescription medicine to help prevent HIV infection. If you choose to take medicine to prevent HIV, you should first get tested for HIV. You should then be tested every 3 months for as long as you are taking the medicine. Follow these instructions at home: Alcohol use Do not drink alcohol if your health care provider tells you not to drink. If you drink alcohol: Limit how much you have to 0-2 drinks a day. Know how much alcohol is in your drink. In the [place] one drink equals one 12 oz bottle of beer (355 mL), one 5 oz glass of wine (148 mL), or one 1 oz glass of hard liquor (44 mL). Lifestyle Do not use any products that contain nicotine or tobacco. These products include cigarettes, chewing tobacco, and vaping devices, such as e-cigarettes. If you need help quitting, ask your health care provider. Do not use street drugs. Do not share needles. Ask your health care provider for help if you need support or information about quitting drugs. General instructions Schedule regular health, dental, and eye exams. Stay current  with your vaccines. Tell your health care provider if: You often feel depressed. You have ever been abused or do not feel safe at home. Summary Adopting a healthy lifestyle and getting preventive care are important in promoting health and wellness. Follow your health care provider's instructions about healthy diet, exercising, and getting tested or screened for diseases. Follow your health care provider's instructions on monitoring your cholesterol and blood pressure. This information is not intended to replace advice given to you by your health care provider. Make sure you discuss any questions you have with your health care provider. Document Revised: 06/21/2020 Document Reviewed:  06/21/2020 Elsevier Patient Education  2024 Elsevier Inc.      Edwina Barth, MD [facility]

## 2023-01-31 ENCOUNTER — Telehealth: Payer: Self-pay

## 2023-01-31 ENCOUNTER — Ambulatory Visit: Payer: Medicare PPO | Admitting: Nurse Practitioner

## 2023-01-31 ENCOUNTER — Encounter: Payer: Self-pay | Admitting: Nurse Practitioner

## 2023-01-31 VITALS — BP 136/64 | HR 64 | Temp 97.3°F | Ht 72.0 in | Wt 203.4 lb

## 2023-01-31 DIAGNOSIS — B029 Zoster without complications: Secondary | ICD-10-CM | POA: Diagnosis not present

## 2023-01-31 MED ORDER — VALACYCLOVIR HCL 1 G PO TABS: 1000.0000 mg | ORAL_TABLET | Freq: Three times a day (TID) | ORAL | 0 refills | Status: AC

## 2023-01-31 MED ORDER — PREDNISONE 20 MG PO TABS: 40.0000 mg | ORAL_TABLET | Freq: Every day | ORAL | 0 refills | Status: DC

## 2023-01-31 NOTE — Telephone Encounter (Signed)
Noted. Thanks.

## 2023-01-31 NOTE — Telephone Encounter (Signed)
Just above waist line in front and little on back. Painful; Sunday rash started and spread more and more. Belly button to spine in back on right side, solid band about 4 inches. There is some weeping in small spot but been growing daily. and started about a week ago.   Patient scheduled at [facility] today at 1:20p. Patient and wife aware

## 2023-01-31 NOTE — Patient Instructions (Signed)
It was great to see you!  Start valtrex 3 times a day for 7 days  Start prednisone 2 tablets daily with food for 5 days for pain. This may make you have trouble sleeping, take it in the morning.   You are contagious to anyone who hasn't had chicken pox and the chicken pox vaccine.   Let's follow-up if your symptoms worsen or any concerns  Take care,  Rodman Pickle, NP

## 2023-01-31 NOTE — Progress Notes (Signed)
Acute Office Visit  Subjective:     Patient ID: Eric Hahn, male    DOB: [birth year], 82 y.o.   MRN: [id number]  Chief Complaint  Patient presents with   Shingles    Rash started on Sunday, having a lot of pain    HPI Patient is in today for rash that started 3 days ago.   Discussed the use of AI scribe software for clinical note transcription with the patient, who gave verbal consent to proceed.  History of Present Illness   The patient, with a history of recurrent shingles, presents with a painful rash that started on Sunday. Initially, the rash was a small patch but has been spreading daily. The patient reports a tingling sensation prior to the appearance of the rash. The patient denies having any fevers. The patient has previously received the Zostavax, but not Shingrix. He denies changes in soap and shampoo. The pain is severe.     ROS See pertinent positives and negatives per HPI.     Objective:    BP 136/64 (BP Location: Left Arm, Patient Position: Sitting, Cuff Size: Normal)   Pulse 64   Temp (!) 97.3 F (36.3 C)   Ht 6' (1.829 m)   Wt 203 lb 6.4 oz (92.3 kg)   SpO2 96%   BMI 27.59 kg/m    Physical Exam Vitals and nursing note reviewed.  Constitutional:      Appearance: Normal appearance.  HENT:     Head: Normocephalic.  Eyes:     Conjunctiva/sclera: Conjunctivae normal.  Cardiovascular:     Rate and Rhythm: Normal rate and regular rhythm.     Pulses: Normal pulses.     Heart sounds: Normal heart sounds.  Pulmonary:     Effort: Pulmonary effort is normal.     Breath sounds: Normal breath sounds.  Musculoskeletal:     Cervical back: Normal range of motion.  Skin:    General: Skin is warm.     Findings: Rash (vesicular rash that does not cross midline to right side/abdomen) present.  Neurological:     General: No focal deficit present.     Mental Status: He is alert and oriented to person, place, and time.  Psychiatric:        Mood and Affect:  Mood normal.        Behavior: Behavior normal.        Thought Content: Thought content normal.        Judgment: Judgment normal.      Assessment & Plan:     Herpes Zoster (Shingles)   He presents with a painful, spreading rash on one side of the body that started on Sunday, experiencing recurrent episodes. We will start Valtrex 1000mg  three times a day for seven days and Prednisone, 40mg  daily with food for five days for pain. After resolution of the current episode, we will consider the Shingrix vaccine. He is advised to avoid contact with unvaccinated individuals or those who have not had chickenpox due to the contagiousness of the virus.      Meds ordered this encounter  Medications   valACYclovir (VALTREX) 1000 MG tablet    Sig: Take 1 tablet (1,000 mg total) by mouth 3 (three) times daily for 7 days.    Dispense:  21 tablet    Refill:  0   predniSONE (DELTASONE) 20 MG tablet    Sig: Take 2 tablets (40 mg total) by mouth daily with breakfast.    Dispense:  10 tablet    Refill:  0    Return if symptoms worsen or fail to improve.  Gerre Scull, NP

## 2023-01-31 NOTE — Telephone Encounter (Signed)
Reason for CRM: Patient has shingles, patients wife called in asking if Dr.Sagardia can write a prescription for it without him having to come in.

## 2023-10-30 ENCOUNTER — Ambulatory Visit: Payer: Self-pay | Admitting: Emergency Medicine

## 2023-10-30 ENCOUNTER — Ambulatory Visit: Admitting: Emergency Medicine

## 2023-10-30 ENCOUNTER — Encounter: Payer: Self-pay | Admitting: Emergency Medicine

## 2023-10-30 ENCOUNTER — Ambulatory Visit

## 2023-10-30 VITALS — BP 118/70 | HR 57 | Temp 98.4°F | Ht 72.0 in | Wt 196.0 lb

## 2023-10-30 DIAGNOSIS — Z13220 Encounter for screening for lipoid disorders: Secondary | ICD-10-CM

## 2023-10-30 DIAGNOSIS — Z13228 Encounter for screening for other metabolic disorders: Secondary | ICD-10-CM

## 2023-10-30 DIAGNOSIS — Z130 Encounter for screening for diseases of the blood and blood-forming organs and certain disorders involving the immune mechanism: Secondary | ICD-10-CM

## 2023-10-30 DIAGNOSIS — Z0000 Encounter for general adult medical examination without abnormal findings: Secondary | ICD-10-CM

## 2023-10-30 DIAGNOSIS — I10 Essential (primary) hypertension: Secondary | ICD-10-CM | POA: Diagnosis not present

## 2023-10-30 DIAGNOSIS — Z1329 Encounter for screening for other suspected endocrine disorder: Secondary | ICD-10-CM | POA: Diagnosis not present

## 2023-10-30 LAB — COMPREHENSIVE METABOLIC PANEL WITH GFR
ALT: 14 U/L (ref 0–53)
AST: 15 U/L (ref 0–37)
Albumin: 4.5 g/dL (ref 3.5–5.2)
Alkaline Phosphatase: 56 U/L (ref 39–117)
BUN: 16 mg/dL (ref 6–23)
CO2: 25 meq/L (ref 19–32)
Calcium: 9.7 mg/dL (ref 8.4–10.5)
Chloride: 105 meq/L (ref 96–112)
Creatinine, Ser: 0.98 mg/dL (ref 0.40–1.50)
GFR: 72.03 mL/min (ref >60.00)
Glucose, Bld: 105 mg/dL — ABNORMAL HIGH (ref 70–99)
Potassium: 4 meq/L (ref 3.5–5.1)
Sodium: 139 meq/L (ref 135–145)
Total Bilirubin: 0.7 mg/dL (ref 0.2–1.2)
Total Protein: 7.5 g/dL (ref 6.0–8.3)

## 2023-10-30 LAB — LIPID PANEL
Cholesterol: 268 mg/dL — ABNORMAL HIGH (ref 0–200)
HDL: 40.1 mg/dL (ref >39.00)
LDL Cholesterol: 192 mg/dL — ABNORMAL HIGH (ref 0–99)
NonHDL: 227.98
Total CHOL/HDL Ratio: 7
Triglycerides: 178 mg/dL — ABNORMAL HIGH (ref 0.0–149.0)
VLDL: 35.6 mg/dL (ref 0.0–40.0)

## 2023-10-30 LAB — CBC WITH DIFFERENTIAL/PLATELET
Basophils Absolute: 0.1 K/uL (ref 0.0–0.1)
Basophils Relative: 1.1 % (ref 0.0–3.0)
Eosinophils Absolute: 0.5 K/uL (ref 0.0–0.7)
Eosinophils Relative: 8.9 % — ABNORMAL HIGH (ref 0.0–5.0)
HCT: 42.2 % (ref 39.0–52.0)
Hemoglobin: 14.3 g/dL (ref 13.0–17.0)
Lymphocytes Relative: 37.9 % (ref 12.0–46.0)
Lymphs Abs: 2 K/uL (ref 0.7–4.0)
MCHC: 33.8 g/dL (ref 30.0–36.0)
MCV: 87.7 fl (ref 78.0–100.0)
Monocytes Absolute: 0.4 K/uL (ref 0.1–1.0)
Monocytes Relative: 7.5 % (ref 3.0–12.0)
Neutro Abs: 2.3 K/uL (ref 1.4–7.7)
Neutrophils Relative %: 44.6 % (ref 43.0–77.0)
Platelets: 229 K/uL (ref 150.0–400.0)
RBC: 4.81 Mil/uL (ref 4.22–5.81)
RDW: 14.2 % (ref 11.5–15.5)
WBC: 5.2 K/uL (ref 4.0–10.5)

## 2023-10-30 LAB — HEMOGLOBIN A1C: Hgb A1c MFr Bld: 6.4 % (ref 4.6–6.5)

## 2023-10-30 NOTE — Patient Instructions (Signed)
 Health Maintenance, Male  Adopting a healthy lifestyle and getting preventive care are important in promoting health and wellness. Ask your health care provider about:  The right schedule for you to have regular tests and exams.  Things you can do on your own to prevent diseases and keep yourself healthy.  What should I know about diet, weight, and exercise?  Eat a healthy diet    Eat a diet that includes plenty of vegetables, fruits, low-fat dairy products, and lean protein.  Do not eat a lot of foods that are high in solid fats, added sugars, or sodium.  Maintain a healthy weight  Body mass index (BMI) is a measurement that can be used to identify possible weight problems. It estimates body fat based on height and weight. Your health care provider can help determine your BMI and help you achieve or maintain a healthy weight.  Get regular exercise  Get regular exercise. This is one of the most important things you can do for your health. Most adults should:  Exercise for at least 150 minutes each week. The exercise should increase your heart rate and make you sweat (moderate-intensity exercise).  Do strengthening exercises at least twice a week. This is in addition to the moderate-intensity exercise.  Spend less time sitting. Even light physical activity can be beneficial.  Watch cholesterol and blood lipids  Have your blood tested for lipids and cholesterol at 82 years of age, then have this test every 5 years.  You may need to have your cholesterol levels checked more often if:  Your lipid or cholesterol levels are high.  You are older than 82 years of age.  You are at high risk for heart disease.  What should I know about cancer screening?  Many types of cancers can be detected early and may often be prevented. Depending on your health history and family history, you may need to have cancer screening at various ages. This may include screening for:  Colorectal cancer.  Prostate cancer.  Skin cancer.  Lung  cancer.  What should I know about heart disease, diabetes, and high blood pressure?  Blood pressure and heart disease  High blood pressure causes heart disease and increases the risk of stroke. This is more likely to develop in people who have high blood pressure readings or are overweight.  Talk with your health care provider about your target blood pressure readings.  Have your blood pressure checked:  Every 3-5 years if you are 82-82 years of age.  Every year if you are 3 years old or older.  If you are between the ages of 60 and 72 and are a current or former smoker, ask your health care provider if you should have a one-time screening for abdominal aortic aneurysm (AAA).  Diabetes  Have regular diabetes screenings. This checks your fasting blood sugar level. Have the screening done:  Once every three years after age 82 if you are at a normal weight and have a low risk for diabetes.  More often and at a younger age if you are overweight or have a high risk for diabetes.  What should I know about preventing infection?  Hepatitis B  If you have a higher risk for hepatitis B, you should be screened for this virus. Talk with your health care provider to find out if you are at risk for hepatitis B infection.  Hepatitis C  Blood testing is recommended for:  Everyone born from [birth year] through 1964.  Anyone  with known risk factors for hepatitis C.  Sexually transmitted infections (STIs)  You should be screened each year for STIs, including gonorrhea and chlamydia, if:  You are sexually active and are younger than 82 years of age.  You are older than 82 years of age and your health care provider tells you that you are at risk for this type of infection.  Your sexual activity has changed since you were last screened, and you are at increased risk for chlamydia or gonorrhea. Ask your health care provider if you are at risk.  Ask your health care provider about whether you are at high risk for HIV. Your health care provider  may recommend a prescription medicine to help prevent HIV infection. If you choose to take medicine to prevent HIV, you should first get tested for HIV. You should then be tested every 3 months for as long as you are taking the medicine.  Follow these instructions at home:  Alcohol use  Do not drink alcohol if your health care provider tells you not to drink.  If you drink alcohol:  Limit how much you have to 0-2 drinks a day.  Know how much alcohol is in your drink. In the [place] one drink equals one 12 oz bottle of beer (355 mL), one 5 oz glass of wine (148 mL), or one 1 oz glass of hard liquor (44 mL).  Lifestyle  Do not use any products that contain nicotine or tobacco. These products include cigarettes, chewing tobacco, and vaping devices, such as e-cigarettes. If you need help quitting, ask your health care provider.  Do not use street drugs.  Do not share needles.  Ask your health care provider for help if you need support or information about quitting drugs.  General instructions  Schedule regular health, dental, and eye exams.  Stay current with your vaccines.  Tell your health care provider if:  You often feel depressed.  You have ever been abused or do not feel safe at home.  Summary  Adopting a healthy lifestyle and getting preventive care are important in promoting health and wellness.  Follow your health care provider's instructions about healthy diet, exercising, and getting tested or screened for diseases.  Follow your health care provider's instructions on monitoring your cholesterol and blood pressure.  This information is not intended to replace advice given to you by your health care provider. Make sure you discuss any questions you have with your health care provider.  Document Revised: 06/21/2020 Document Reviewed: 06/21/2020  Elsevier Patient Education  2024 [employer].

## 2023-10-30 NOTE — Progress Notes (Signed)
 Eric Hahn 83 y.o.   Chief Complaint  Patient presents with   Annual Exam    Patient here for physical. No other concerns    HISTORY OF PRESENT ILLNESS: This is a 83 y.o. male here for annual physical exam. History of hypertension. Overall doing well.  Has no complaints or medical concerns today.  HPI   Prior to Admission medications   Medication Sig Start Date End Date Taking? Authorizing Provider  aspirin  81 MG tablet Take 81 mg by mouth daily.     Yes [provider]  Flaxseed, Linseed, (FLAX SEED OIL) 1000 MG CAPS Take 1 capsule by mouth daily. daily   Yes [provider]  fosinopril  (MONOPRIL ) 40 MG tablet TAKE 1 TABLET(40 MG) BY MOUTH DAILY 10/25/22  Yes Ajai Terhaar, [place], MD  KRILL OIL PO Take 1 tablet by mouth daily.    Yes [provider]  Red Yeast Rice 600 MG TABS Take 600 mg by mouth daily. Daily   Yes [provider]    Allergies  Allergen Reactions   Amoxicillin Hives and Rash   Sulfa Antibiotics Other (See Comments)    Unknown   Statins Other (See Comments)    Pt is intolerant-muscle aches and weakness    Patient Active Problem List   Diagnosis Date Noted   Thyroid  nodule 10/21/2014   Asymptomatic stenosis of right carotid artery 09/15/2014   Other and unspecified hyperlipidemia 06/10/2012   Abnormal ECG 06/06/2010   Hypertension 06/06/2010    Past Medical History:  Diagnosis Date   Arthritis    Carotid artery occlusion    Full dentures    History of asthma    pt. states as a child   History of bronchitis    History of kidney stones    History of pneumonia    HOH (hard of hearing)    Hyperlipidemia    Hypertension    Obesity    PONV (postoperative nausea and vomiting)    pt. states that was sick with first surgery but 2nd surgery used patch behind ears and had no issues    Past Surgical History:  Procedure Laterality Date   CAROTID ENDARTERECTOMY Right 09-15-14   ENDARTERECTOMY Right 09/15/2014    Procedure: RIGHT CAROTID ENDARTERECTOMY WITH HEMASHIELD PATCH ANGIOPLASTY ;  Surgeon: Carlin FORBES Haddock, MD;  [facility]: [facility];  Service: Vascular;  Laterality: Right;   NASAL SINUS SURGERY     SPINE SURGERY     C-7 disk surgery  by Dr. Aleene Pontes     Social History   Socioeconomic History   Marital status: Married    Spouse name: Not on file   Number of children: Not on file   Years of education: Not on file   Highest education level: GED or equivalent  Occupational History   Not on file  Tobacco Use   Smoking status: Former    Current packs/day: 0.00    Types: Cigarettes    Quit date: 06/06/1975    Years since quitting: 48.4   Smokeless tobacco: Never  Substance and Sexual Activity   Alcohol use: No   Drug use: No   Sexual activity: Not on file  Other Topics Concern   Not on file  Social History Narrative   Married   Education: High School   Exercise: Yes   Social Drivers of Health   Financial Resource Strain: Low Risk  (10/26/2023)   Overall Financial Resource Strain (CARDIA)    Difficulty of Paying  Living Expenses: Not very hard  Food Insecurity: No Food Insecurity (10/26/2023)   Hunger Vital Sign    Worried About Running Out of Food in the Last Year: Never true    Ran Out of Food in the Last Year: Never true  Transportation Needs: No Transportation Needs (10/26/2023)   PRAPARE - Administrator, Civil Service (Medical): No    Lack of Transportation (Non-Medical): No  Physical Activity: Insufficiently Active (10/26/2023)   Exercise Vital Sign    Days of Exercise per Week: 3 days    Minutes of Exercise per Session: 30 min  Stress: No Stress Concern Present (10/26/2023)   [employer] of Occupational Health - Occupational Stress Questionnaire    Feeling of Stress: Only a little  Social Connections: Moderately Isolated (10/26/2023)   Social Connection and Isolation Panel    Frequency of Communication with Friends and Family: Twice a week    Frequency  of Social Gatherings with Friends and Family: Once a week    Attends Religious Services: Patient declined    Database administrator or Organizations: No    Attends Engineer, structural: Not on file    Marital Status: Married  Intimate Partner Violence: Unknown (05/20/2021)   Received from [facility]   HITS    Physically Hurt: Not on file    Insult or Talk Down To: Not on file    Threaten Physical Harm: Not on file    Scream or Curse: Not on file    Family History  Problem Relation Age of Onset   Hyperlipidemia Father    Arthritis Mother    Arthritis Sister      Review of Systems  Constitutional: Negative.  Negative for chills and fever.  HENT: Negative.  Negative for congestion and sore throat.   Respiratory: Negative.  Negative for cough and shortness of breath.   Cardiovascular: Negative.  Negative for chest pain and palpitations.  Gastrointestinal:  Negative for abdominal pain, constipation, diarrhea, nausea and vomiting.  Genitourinary: Negative.  Negative for dysuria and hematuria.  Skin: Negative.  Negative for rash.  Neurological: Negative.  Negative for dizziness and headaches.  All other systems reviewed and are negative.   Vitals:   10/30/23 0848  BP: 118/70  Pulse: (!) 57  Temp: 98.4 F (36.9 C)  SpO2: 95%    Physical Exam Vitals reviewed.  Constitutional:      Appearance: Normal appearance.  HENT:     Head: Normocephalic.     Right Ear: Tympanic membrane, ear canal and external ear normal.     Left Ear: Tympanic membrane, ear canal and external ear normal.     Mouth/Throat:     Mouth: Mucous membranes are moist.     Pharynx: Oropharynx is clear.  Eyes:     Extraocular Movements: Extraocular movements intact.     Pupils: Pupils are equal, round, and reactive to light.  Cardiovascular:     Rate and Rhythm: Normal rate and regular rhythm.     Pulses: Normal pulses.     Heart sounds: Normal heart sounds.  Pulmonary:     Effort: Pulmonary  effort is normal.     Breath sounds: Normal breath sounds.  Abdominal:     Palpations: Abdomen is soft.     Tenderness: There is no abdominal tenderness.  Musculoskeletal:     Cervical back: No tenderness.     Right lower leg: No edema.     Left lower leg: No edema.  Lymphadenopathy:     Cervical: No cervical adenopathy.  Skin:    General: Skin is warm and dry.     Capillary Refill: Capillary refill takes less than 2 seconds.  Neurological:     General: No focal deficit present.     Mental Status: He is alert and oriented to person, place, and time.  Psychiatric:        Mood and Affect: Mood normal.        Behavior: Behavior normal.      ASSESSMENT & PLAN: Problem List Items Addressed This Visit       Cardiovascular and Mediastinum   Hypertension   BP Readings from Last 3 Encounters:  10/30/23 118/70  01/31/23 136/64  10/25/22 124/66  Well-controlled hypertension Continue Monopril  40 mg daily       Other Visit Diagnoses       Routine general medical examination at a health care facility    -  Primary   Relevant Orders   CBC with Differential/Platelet   Comprehensive metabolic panel with GFR   Hemoglobin A1c   Lipid panel     Screening for deficiency anemia       Relevant Orders   CBC with Differential/Platelet     Screening for lipoid disorders       Relevant Orders   Lipid panel     Screening for endocrine, metabolic and immunity disorder       Relevant Orders   Comprehensive metabolic panel with GFR   Hemoglobin A1c      Modifiable risk factors discussed with patient. Anticipatory guidance according to age provided. The following topics were also discussed: Social Determinants of Health Smoking.  Non-smoker Diet and nutrition Benefits of exercise Cancer screening.  No screening advisable at this age Vaccinations review and recommendations Cardiovascular risk assessment and need for blood work Mental health including depression and anxiety Fall  and accident prevention  Patient Instructions  Health Maintenance, Male Adopting a healthy lifestyle and getting preventive care are important in promoting health and wellness. Ask your health care provider about: The right schedule for you to have regular tests and exams. Things you can do on your own to prevent diseases and keep yourself healthy. What should I know about diet, weight, and exercise? Eat a healthy diet  Eat a diet that includes plenty of vegetables, fruits, low-fat dairy products, and lean protein. Do not eat a lot of foods that are high in solid fats, added sugars, or sodium. Maintain a healthy weight Body mass index (BMI) is a measurement that can be used to identify possible weight problems. It estimates body fat based on height and weight. Your health care provider can help determine your BMI and help you achieve or maintain a healthy weight. Get regular exercise Get regular exercise. This is one of the most important things you can do for your health. Most adults should: Exercise for at least 150 minutes each week. The exercise should increase your heart rate and make you sweat (moderate-intensity exercise). Do strengthening exercises at least twice a week. This is in addition to the moderate-intensity exercise. Spend less time sitting. Even light physical activity can be beneficial. Watch cholesterol and blood lipids Have your blood tested for lipids and cholesterol at 82 years of age, then have this test every 5 years. You may need to have your cholesterol levels checked more often if: Your lipid or cholesterol levels are high. You are older than 82 years of age. You are at  high risk for heart disease. What should I know about cancer screening? Many types of cancers can be detected early and may often be prevented. Depending on your health history and family history, you may need to have cancer screening at various ages. This may include screening for: Colorectal  cancer. Prostate cancer. Skin cancer. Lung cancer. What should I know about heart disease, diabetes, and high blood pressure? Blood pressure and heart disease High blood pressure causes heart disease and increases the risk of stroke. This is more likely to develop in people who have high blood pressure readings or are overweight. Talk with your health care provider about your target blood pressure readings. Have your blood pressure checked: Every 3-5 years if you are 99-66 years of age. Every year if you are 61 years old or older. If you are between the ages of 70 and 54 and are a current or former smoker, ask your health care provider if you should have a one-time screening for abdominal aortic aneurysm (AAA). Diabetes Have regular diabetes screenings. This checks your fasting blood sugar level. Have the screening done: Once every three years after age 81 if you are at a normal weight and have a low risk for diabetes. More often and at a younger age if you are overweight or have a high risk for diabetes. What should I know about preventing infection? Hepatitis B If you have a higher risk for hepatitis B, you should be screened for this virus. Talk with your health care provider to find out if you are at risk for hepatitis B infection. Hepatitis C Blood testing is recommended for: Everyone born from [birth year] through 1964. Anyone with known risk factors for hepatitis C. Sexually transmitted infections (STIs) You should be screened each year for STIs, including gonorrhea and chlamydia, if: You are sexually active and are younger than 82 years of age. You are older than 82 years of age and your health care provider tells you that you are at risk for this type of infection. Your sexual activity has changed since you were last screened, and you are at increased risk for chlamydia or gonorrhea. Ask your health care provider if you are at risk. Ask your health care provider about whether you are at  high risk for HIV. Your health care provider may recommend a prescription medicine to help prevent HIV infection. If you choose to take medicine to prevent HIV, you should first get tested for HIV. You should then be tested every 3 months for as long as you are taking the medicine. Follow these instructions at home: Alcohol use Do not drink alcohol if your health care provider tells you not to drink. If you drink alcohol: Limit how much you have to 0-2 drinks a day. Know how much alcohol is in your drink. In the [place] one drink equals one 12 oz bottle of beer (355 mL), one 5 oz glass of wine (148 mL), or one 1 oz glass of hard liquor (44 mL). Lifestyle Do not use any products that contain nicotine or tobacco. These products include cigarettes, chewing tobacco, and vaping devices, such as e-cigarettes. If you need help quitting, ask your health care provider. Do not use street drugs. Do not share needles. Ask your health care provider for help if you need support or information about quitting drugs. General instructions Schedule regular health, dental, and eye exams. Stay current with your vaccines. Tell your health care provider if: You often feel depressed. You have ever been  abused or do not feel safe at home. Summary Adopting a healthy lifestyle and getting preventive care are important in promoting health and wellness. Follow your health care provider's instructions about healthy diet, exercising, and getting tested or screened for diseases. Follow your health care provider's instructions on monitoring your cholesterol and blood pressure. This information is not intended to replace advice given to you by your health care provider. Make sure you discuss any questions you have with your health care provider. Document Revised: 06/21/2020 Document Reviewed: 06/21/2020 Elsevier Patient Education  2024 [employer]vier Inc[employer]      Emil Schaumann, MD [facility]

## 2023-10-30 NOTE — Assessment & Plan Note (Signed)
 BP Readings from Last 3 Encounters:  10/30/23 118/70  01/31/23 136/64  10/25/22 124/66  Well-controlled hypertension Continue Monopril  40 mg daily

## 2023-11-29 ENCOUNTER — Other Ambulatory Visit: Payer: Self-pay | Admitting: Emergency Medicine

## 2023-11-29 DIAGNOSIS — I10 Essential (primary) hypertension: Secondary | ICD-10-CM
